# Patient Record
Sex: Female | Born: 1955 | Race: White | Hispanic: No | Marital: Single | Smoking: Never smoker
Health system: Southern US, Community
[De-identification: ages and names within clinical notes are randomized; demographics above are authoritative.]

## PROBLEM LIST (undated history)

## (undated) DIAGNOSIS — T7840XA Allergy, unspecified, initial encounter: Secondary | ICD-10-CM

## (undated) DIAGNOSIS — M8580 Other specified disorders of bone density and structure, unspecified site: Secondary | ICD-10-CM

## (undated) DIAGNOSIS — M1990 Unspecified osteoarthritis, unspecified site: Secondary | ICD-10-CM

## (undated) HISTORY — DX: Other specified disorders of bone density and structure, unspecified site: M85.80

## (undated) HISTORY — DX: Unspecified osteoarthritis, unspecified site: M19.90

## (undated) HISTORY — DX: Allergy, unspecified, initial encounter: T78.40XA

## (undated) HISTORY — PX: ABDOMINAL HYSTERECTOMY: SHX81

---

## 2002-12-09 ENCOUNTER — Encounter: Payer: Self-pay | Admitting: Emergency Medicine

## 2002-12-09 ENCOUNTER — Emergency Department (HOSPITAL_COMMUNITY): Admission: EM | Admit: 2002-12-09 | Discharge: 2002-12-09 | Payer: Self-pay | Admitting: Emergency Medicine

## 2003-06-18 ENCOUNTER — Ambulatory Visit (HOSPITAL_COMMUNITY): Admission: RE | Admit: 2003-06-18 | Discharge: 2003-06-18 | Payer: Self-pay | Admitting: Obstetrics and Gynecology

## 2005-01-16 ENCOUNTER — Other Ambulatory Visit: Admission: RE | Admit: 2005-01-16 | Discharge: 2005-01-16 | Payer: Self-pay | Admitting: Obstetrics and Gynecology

## 2006-01-21 ENCOUNTER — Other Ambulatory Visit: Admission: RE | Admit: 2006-01-21 | Discharge: 2006-01-21 | Payer: Self-pay | Admitting: Obstetrics and Gynecology

## 2007-01-29 ENCOUNTER — Other Ambulatory Visit: Admission: RE | Admit: 2007-01-29 | Discharge: 2007-01-29 | Payer: Self-pay | Admitting: Obstetrics and Gynecology

## 2008-02-03 ENCOUNTER — Ambulatory Visit: Payer: Self-pay | Admitting: Obstetrics and Gynecology

## 2008-02-03 ENCOUNTER — Encounter: Payer: Self-pay | Admitting: Obstetrics and Gynecology

## 2008-02-03 ENCOUNTER — Other Ambulatory Visit: Admission: RE | Admit: 2008-02-03 | Discharge: 2008-02-03 | Payer: Self-pay | Admitting: Obstetrics and Gynecology

## 2008-03-17 ENCOUNTER — Ambulatory Visit: Payer: Self-pay | Admitting: Obstetrics and Gynecology

## 2008-03-19 ENCOUNTER — Ambulatory Visit: Payer: Self-pay | Admitting: Obstetrics and Gynecology

## 2008-03-22 ENCOUNTER — Inpatient Hospital Stay (HOSPITAL_COMMUNITY): Admission: RE | Admit: 2008-03-22 | Discharge: 2008-03-24 | Payer: Self-pay | Admitting: Obstetrics and Gynecology

## 2008-03-22 ENCOUNTER — Ambulatory Visit: Payer: Self-pay | Admitting: Obstetrics and Gynecology

## 2008-03-22 ENCOUNTER — Encounter: Payer: Self-pay | Admitting: Obstetrics and Gynecology

## 2008-03-30 ENCOUNTER — Ambulatory Visit: Payer: Self-pay | Admitting: Obstetrics and Gynecology

## 2008-05-03 ENCOUNTER — Ambulatory Visit: Payer: Self-pay | Admitting: Obstetrics and Gynecology

## 2009-02-17 ENCOUNTER — Other Ambulatory Visit: Admission: RE | Admit: 2009-02-17 | Discharge: 2009-02-17 | Payer: Self-pay | Admitting: Obstetrics and Gynecology

## 2009-02-17 ENCOUNTER — Ambulatory Visit: Payer: Self-pay | Admitting: Obstetrics and Gynecology

## 2010-03-16 ENCOUNTER — Ambulatory Visit
Admission: RE | Admit: 2010-03-16 | Discharge: 2010-03-16 | Payer: Self-pay | Source: Home / Self Care | Attending: Obstetrics and Gynecology | Admitting: Obstetrics and Gynecology

## 2010-03-16 ENCOUNTER — Other Ambulatory Visit: Payer: Self-pay | Admitting: Obstetrics and Gynecology

## 2010-03-16 ENCOUNTER — Other Ambulatory Visit
Admission: RE | Admit: 2010-03-16 | Discharge: 2010-03-16 | Payer: Self-pay | Source: Home / Self Care | Admitting: Obstetrics and Gynecology

## 2010-03-19 ENCOUNTER — Encounter: Payer: Self-pay | Admitting: Obstetrics and Gynecology

## 2010-06-12 LAB — CBC
HCT: 26.1 % — ABNORMAL LOW (ref 36.0–46.0)
HCT: 38.2 % (ref 36.0–46.0)
Hemoglobin: 13.1 g/dL (ref 12.0–15.0)
MCHC: 34.3 g/dL (ref 30.0–36.0)
MCV: 97.1 fL (ref 78.0–100.0)
MCV: 97.3 fL (ref 78.0–100.0)
Platelets: 254 10*3/uL (ref 150–400)
RBC: 2.69 MIL/uL — ABNORMAL LOW (ref 3.87–5.11)
RBC: 3.92 MIL/uL (ref 3.87–5.11)
RDW: 13.1 % (ref 11.5–15.5)
WBC: 5.4 10*3/uL (ref 4.0–10.5)
WBC: 5.5 10*3/uL (ref 4.0–10.5)

## 2010-06-12 LAB — URINALYSIS, ROUTINE W REFLEX MICROSCOPIC
Bilirubin Urine: NEGATIVE
Glucose, UA: NEGATIVE mg/dL
Ketones, ur: NEGATIVE mg/dL
Leukocytes, UA: NEGATIVE
Nitrite: NEGATIVE
Protein, ur: NEGATIVE mg/dL
Specific Gravity, Urine: 1.01 (ref 1.005–1.030)
Urobilinogen, UA: 0.2 mg/dL (ref 0.0–1.0)
pH: 8 (ref 5.0–8.0)

## 2010-06-12 LAB — HCG, SERUM, QUALITATIVE: Preg, Serum: NEGATIVE

## 2010-07-11 NOTE — Discharge Summary (Signed)
[name], Megan Lam                ACCOUNT NO.:  [id number]   MEDICAL RECORD NO.:  [id number]          PATIENT TYPE:  INP   LOCATION:  9315                          FACILITY:  [facility]   PHYSICIAN:  Daniel L. Gottsegen, M.D.DATE OF BIRTH:  1955-03-04   DATE OF ADMISSION:  03/22/2008  DATE OF DISCHARGE:  03/24/2008                               DISCHARGE SUMMARY   The patient is a 55 year old female who had extremely large fibroids and  a cyst on her left ovary, who entered the hospital for surgery.  On the  day of admission, a supracervical hysterectomy and left salpingo-  oophorectomy was done.  The patient's postoperative course was  uneventful.  By the second postoperative day, she was passing gas,  passing urine, and keeping food down.  She was discharged on Vicodin  5/500 and Motrin to use whichever was more appropriate for pain relief.  She will see me in 1 week.  She will advance her diet as tolerated and  she will be completely ambulatory.  Final pathology report is not  available at time of dictation.   LABORATORY DATA:  On admission showed a normal CBC and a normal  urinalysis.   FINAL DIAGNOSES:  Symptomatic fibroids, left ovarian cyst.   OPERATION:  Supracervical hysterectomy, left salpingo-oophorectomy.   CONDITION ON DISCHARGE:  Improved.      Daniel L. Eda Paschal, M.D.  Electronically Signed     DLG/MEDQ  D:  03/24/2008  T:  03/24/2008  Job:  [id number]

## 2010-07-11 NOTE — Op Note (Signed)
Megan Lam, SHANKMAN                ACCOUNT NO.:  [id number]   MEDICAL RECORD NO.:  [id number]          PATIENT TYPE:  INP   LOCATION:  9315                          FACILITY:  [facility]   PHYSICIAN:  Daniel L. Gottsegen, M.D.DATE OF BIRTH:  December 30, 1955   DATE OF PROCEDURE:  03/22/2008  DATE OF DISCHARGE:                               OPERATIVE REPORT   PREOPERATIVE DIAGNOSES:  Symptomatic fibroids, left ovarian cyst.   POSTOPERATIVE DIAGNOSES:  Symptomatic fibroids, left ovarian cyst.   OPERATIONS:  Supracervical hysterectomy, left salpingo-oophorectomy.   SURGEON:  Daniel L. Eda Paschal, MD   FIRST ASSISTANT:  Timothy P. Fontaine, MD   FINDINGS:  At the time of surgery, the patient had multiple fibroids,  enlarging her uterus to at least 24-25 weeks' size.  When the uterus was  removed, total weight of the uterus was over 3500 g.  Left ovary was  enlarged by an ovarian cyst that appeared to have replaced most of the  left ovary, right ovary and tube were normal.  Pelvic peritoneum was  free of disease.  The patient's ileocecal junction was identified.  She  had a normal appendix, rest of the exploration of the abdomen was  normal.   PROCEDURE:  After adequate general endotracheal anesthesia, the patient  was placed in supine position, prepped and draped in the usual sterile  manner.  A Foley catheter was inserted into the patient's bladder.  A  Pfannenstiel incision was made, the fascia was opened transversely.  The  peritoneum was entered vertically.  Subcutaneous bleeders were clamped  and bovied as encountered.  When the peritoneal cavity was opened, the  above findings were noted.  The uterus was delivered with some  difficulty, but could be delivered.  The round ligaments were bipolared  and cut and a vesicouterine fold of peritoneum was sharply incised.  On  the right side the utero-ovarian ligament and fallopian tube were  clamped, cut, and doubly suture ligated leaving the  ovary intact.  Prior  to starting the dissection, pelvic peritoneal washings were obtained.  On the left side, there was a large ovarian cyst of 5 plus centimeters  that appeared to replace most of the ovary and was felt that the  patient's age the safest way to handle it without rupture was to remove  the ovary.  Therefore, the left IP ligament was clamped, cut, and doubly  suture ligated.  The bladder flap was advanced inferiorly.  There was a  fibroid on the lower uterine segment that required some dissection to  get it free from the bladder flap.  The uterine arteries were clamped,  cut, and doubly suture ligated.  There were additional bites needed  below the uterine arteries in order to control bleeding, and these were  clamped, cut, and doubly suture ligated.  Finally it was felt that the  uterus was free, it was bisected at  the cervix  and the uterus was sent  to Pathology for tissue diagnosis as was the left ovary tube.  The  uterus weighed 3500 g.  Suture material for all the above-mentioned  pedicles was #1 chromic catgut.  The cervical stump was treated in the  endocervix with cautery to prevent CIN and also to prevent postoperative  bleeding, and then the cervix was closed with figure-of-eights of 0  chromic.  There was some oozing that was controlled with the Bovie.  Surgicel was left in the stump.  The vesicouterine fold of peritoneum  was then sewn posteriorly to the posterior peritoneum to cover the  cervical stump with an interrupted 2-0 Vicryl.  Two sponge, needle, and  instruments counts were correct.  The perineal cavity was closed with a  running 0 Vicryl.  An On-Q catheter was placed both sub and super  fascial.  The fascia was then closed with two running 0 Vicryl.  The  skin was closed with a 3-0 plain subcuticular suture.  On-Q was filled  with 1% Xylocaine, and then it was to be continued with 0.5% Marcaine.  Two sponge, needle, and instruments counts were  correct.  Estimated  blood loss was 300 mL and none replaced.  The patient tolerated the  procedure well and left the operating room in satisfactory condition,  draining clear urine from her Foley catheter.      Daniel L. Eda Paschal, M.D.  Electronically Signed     DLG/MEDQ  D:  03/22/2008  T:  03/22/2008  Job:  [id number]

## 2010-07-11 NOTE — H&P (Signed)
[name], LOW                ACCOUNT NO.:  [id number]   MEDICAL RECORD NO.:  [id number]          PATIENT TYPE:  AMB   LOCATION:  SDC                           FACILITY:  [facility]   PHYSICIAN:  Daniel L. Gottsegen, M.D.DATE OF BIRTH:  08-25-1955   DATE OF ADMISSION:  DATE OF DISCHARGE:                              HISTORY & PHYSICAL   She is for the operating room on Monday, January 25 at 7:30.   CHIEF COMPLAINT:  Extremely large fibroids with menorrhagia, anemia,  abdominal pain and pressure, and external compression on her ureters  causing dilatation of her calyces of her kidneys.   HISTORY OF PRESENT ILLNESS:  The patient is a 55 year old nulligravida,  who was first seen by me in the office in 2004 when she presented with  menometrorrhagia and a hemoglobin of 7.6.  At that time, she had  fibroids that were approximately 16-week size.  She was treated  conservatively with iron and oral progestins and her bleeding stopped.  Over the years, she has been treated with oral progestins including oral  contraceptives, Megace and iron, and she has remained fairly normal in  terms of her hemoglobin.  However, her fibroids have continued to grow.  Multiple discussions were had with her regarding myomectomy or  hysterectomy.  The patient was always reluctant to proceed and she  really did pretty well until the last year when her abdominal discomfort  and pain became so severe that she thought she should proceed with  surgery.  She was seen by her internist, because some of the pressure  actually  was in the upper quadrants.  A CT scan had been ordered which  was completely normal, except for mild caliectasis and ureterectasis.  Because of this persistence of pain, renal impairment, hemoglobin that  will sometimes drop when patient bleeds heavily in spite of oral  contraceptives, she now enters the hospital for surgery.  Discussions  were had with her.  I told her that at her age I felt  hysterectomy was a  better treatment.  After much reflection, she is comfortable with this;  however, she said she would like to keep her cervix.  We, therefore,  will do a supracervical hysterectomy.  She also would like to keep her  ovarys.  She understands the pros and cons of having her ovaries removed  including the risk of ovarian cancer, but she would like to proceed with  conservative therapy.  The last time that she was scanned, we could not  see a right ovary at all.  Her left ovary, however, had a thin-walled,  echo-free cystic mass of about 3-1/2 cm.  The patient has given me  permission to do an ovarian cystectomy or unilateral salpingo-  oophorectomy, but we will only do a bilateral oophorectomy for a  malignancy which we did not believe we are going to find.   PAST MEDICAL HISTORY:  Otherwise unremarkable.  She is on no medications  other than oral progestins and her iron.  She is allergic to no drugs.  Her only other surgery has been hand  surgery in 2005 and wisdom tooth  surgery.   FAMILY HISTORY:  Her grandparents and her father were diabetic.  Her  father is also hypertensive.  She has a paternal grandmother and a first  cousin with colon cancer.  Her grandfather and her father have coronary  artery disease and she has a maternal aunt with breast cancer.   SOCIAL HISTORY:  Noncontributory.   REVIEW OF SYSTEMS:  HEENT:  Negative.  CARDIAC:  Negative.  RESPIRATORY:  Negative.  GI:  Severe abdominal discomfort, felt to be secondary to her  fibroids.  GU:  Urinary frequency, probably skin secondary to  compression from fibroids.  She has a negative urine and she has no  other urinary symptoms.   PHYSICAL EXAMINATION:  GENERAL:  The patient is a well-developed and  well-nourished female in no acute distress.  VITAL SIGNS:  Her blood pressure is 100/74, her pulse is 80 and regular,  her respirations 16 and unlabored, and she is afebrile.  HEENT:  Within  normal  limits.  NECK:  Supple.  Trachea in the midline.  Thyroid is not enlarged.  LUNGS:  Clear to P&A.  HEART:  No thrills, heaves, or murmurs.  BREASTS:  No masses.  ABDOMEN:  An extremely large fibroid uterus that extends above the  umbilicus and is best estimated to be the size of someone who is 24  weeks' pregnant.  Other than that, there are no abdominal masses.  PELVIC:  External is within normal limits.  BUS is within normal limits.  Vaginal exam is within normal limits.  Cervix is clean.  Pap smear shows  no atypia.  Uterus is enlarged by multiple myomas as noted above to 24-  week size.  Adnexa are not accessible to exam due to the above.  RECTAL:  Negative.  EXTREMITIES:  Within normal limits.   ADMISSION IMPRESSION:  Extremely large fibroids with abdominal pain.  Compression of ureters, anemia, and menorrhagia.   PLAN:  Supracervical hysterectomy, treatment of any adnexal disease  surgically with the exception of bilateral oophorectomy unless a  malignancy is found.      Daniel L. Eda Paschal, M.D.  Electronically Signed     DLG/MEDQ  D:  03/21/2008  T:  03/22/2008  Job:  [id number]

## 2010-12-20 ENCOUNTER — Other Ambulatory Visit: Payer: Self-pay | Admitting: Physician Assistant

## 2010-12-20 ENCOUNTER — Ambulatory Visit
Admission: RE | Admit: 2010-12-20 | Discharge: 2010-12-20 | Disposition: A | Discharge status: Home / Self Care | Payer: Self-pay | Source: Ambulatory Visit | Attending: Physician Assistant | Admitting: Physician Assistant

## 2010-12-20 ENCOUNTER — Ambulatory Visit
Admission: RE | Admit: 2010-12-20 | Discharge: 2010-12-20 | Disposition: A | Discharge status: Home / Self Care | Payer: No Typology Code available for payment source | Source: Ambulatory Visit | Attending: Physician Assistant | Admitting: Physician Assistant

## 2010-12-20 DIAGNOSIS — M79609 Pain in unspecified limb: Secondary | ICD-10-CM

## 2010-12-21 ENCOUNTER — Encounter: Payer: Self-pay | Admitting: Obstetrics and Gynecology

## 2011-12-24 ENCOUNTER — Encounter: Payer: Self-pay | Admitting: Obstetrics and Gynecology

## 2012-03-21 ENCOUNTER — Encounter: Payer: Self-pay | Admitting: Gastroenterology

## 2012-04-17 ENCOUNTER — Ambulatory Visit: Payer: Federal, State, Local not specified - PPO | Admitting: *Deleted

## 2012-04-17 DIAGNOSIS — Z1211 Encounter for screening for malignant neoplasm of colon: Secondary | ICD-10-CM

## 2012-04-17 NOTE — Progress Notes (Signed)
Pt already has colonoscopy set up at [facility] for 04-28-12; she already had her pre-procedure visit.  She is unsure why she is here today.  I reviewed her chart and feel that her family MD referred her to both places.  She decides to keep her appt with the [facility] physician  Colonoscopy for 04-23-12 cancelled

## 2012-04-23 ENCOUNTER — Encounter: Payer: Self-pay | Admitting: Gastroenterology

## 2012-07-12 DIAGNOSIS — R21 Rash and other nonspecific skin eruption: Secondary | ICD-10-CM | POA: Insufficient documentation

## 2012-07-12 DIAGNOSIS — Z79899 Other long term (current) drug therapy: Secondary | ICD-10-CM | POA: Insufficient documentation

## 2012-07-12 DIAGNOSIS — Y939 Activity, unspecified: Secondary | ICD-10-CM | POA: Insufficient documentation

## 2012-07-12 DIAGNOSIS — Y929 Unspecified place or not applicable: Secondary | ICD-10-CM | POA: Insufficient documentation

## 2012-07-12 DIAGNOSIS — I998 Other disorder of circulatory system: Secondary | ICD-10-CM | POA: Insufficient documentation

## 2012-07-12 NOTE — ED Notes (Signed)
Pt states insect bite to upper right thigh last night, and today noticed bruising around site.

## 2012-07-13 ENCOUNTER — Encounter (HOSPITAL_COMMUNITY): Payer: Self-pay | Admitting: Emergency Medicine

## 2012-07-13 ENCOUNTER — Emergency Department (HOSPITAL_COMMUNITY)
Admission: EM | Admit: 2012-07-13 | Discharge: 2012-07-13 | Disposition: A | Discharge status: Home / Self Care | Payer: Federal, State, Local not specified - PPO | Attending: Emergency Medicine | Admitting: Emergency Medicine

## 2012-07-13 DIAGNOSIS — R58 Hemorrhage, not elsewhere classified: Secondary | ICD-10-CM

## 2012-07-13 LAB — CBC WITH DIFFERENTIAL/PLATELET
Basophils Absolute: 0 10*3/uL (ref 0.0–0.1)
Basophils Relative: 1 % (ref 0–1)
Eosinophils Absolute: 0.1 10*3/uL (ref 0.0–0.7)
Eosinophils Relative: 3 % (ref 0–5)
HCT: 36.6 % (ref 36.0–46.0)
MCH: 31.2 pg (ref 26.0–34.0)
MCHC: 35.2 g/dL (ref 30.0–36.0)
MCV: 88.6 fL (ref 78.0–100.0)
Monocytes Absolute: 0.3 10*3/uL (ref 0.1–1.0)
RDW: 12.7 % (ref 11.5–15.5)

## 2012-07-13 LAB — POCT I-STAT, CHEM 8
Creatinine, Ser: 0.6 mg/dL (ref 0.50–1.10)
Glucose, Bld: 102 mg/dL — ABNORMAL HIGH (ref 70–99)
Hemoglobin: 12.2 g/dL (ref 12.0–15.0)
Potassium: 3.8 mEq/L (ref 3.5–5.1)
TCO2: 28 mmol/L (ref 0–100)

## 2012-07-13 MED ORDER — DIPHENHYDRAMINE HCL 25 MG PO TABS: 50.0000 mg | ORAL_TABLET | ORAL | Status: AC | PRN

## 2012-07-13 NOTE — ED Provider Notes (Addendum)
History     CSN: [id number]  Arrival date & time 07/12/12  2318   First MD Initiated Contact with Patient 07/13/12 0241      Chief Complaint  Patient presents with  . Insect Bite    (Consider location/radiation/quality/duration/timing/severity/associated sxs/prior treatment) Patient is a 57 y.o. female presenting with rash. The history is provided by the patient. No language interpreter was used.  Rash Location:  Leg (2 small area one on each proximal thigh) Quality: bruising and itchiness   Quality: not blistering, not burning, not draining, not painful, not peeling, not swelling and not weeping   Severity:  Mild Onset quality:  Sudden Timing:  Constant Progression:  Unchanged Chronicity:  New Context: insect bite/sting   Relieved by:  Nothing Exacerbated by: scratching. Ineffective treatments:  None tried Associated symptoms: no fatigue, no myalgias and no periorbital edema   Working outside thought she was bit by an insect and has scratched and caused bruising  No past medical history on file.  No past surgical history on file.  No family history on file.  History  Substance Use Topics  . Smoking status: Not on file  . Smokeless tobacco: Not on file  . Alcohol Use: Not on file    OB History   No data available      Review of Systems  Constitutional: Negative for fatigue.  Musculoskeletal: Negative for myalgias.  Skin: Positive for rash.  All other systems reviewed and are negative.    Allergies  Review of patient's allergies indicates no known allergies.  Home Medications   Current Outpatient Rx  Name  Route  Sig  Dispense  Refill  . cholecalciferol (VITAMIN D) 1000 UNITS tablet   Oral   Take 1,000 Units by mouth daily.         Marland Kitchen ibuprofen (ADVIL,MOTRIN) 200 MG tablet   Oral   Take 200-400 mg by mouth every 6 (six) hours as needed for pain.         . Multiple Vitamin (MULTIVITAMIN WITH MINERALS) TABS   Oral   Take 1 tablet by mouth  daily.           BP 118/77  Pulse 70  Temp(Src) 98.1 F (36.7 C) (Oral)  Resp 18  SpO2 98%  Physical Exam  Constitutional: She is oriented to person, place, and time. She appears well-developed and well-nourished. No distress.  HENT:  Head: Normocephalic and atraumatic.  Mouth/Throat: Oropharynx is clear and moist.  Eyes: Conjunctivae are normal. Pupils are equal, round, and reactive to light.  Neck: Normal range of motion. Neck supple.  Cardiovascular: Normal rate, regular rhythm and intact distal pulses.   Pulmonary/Chest: Effort normal and breath sounds normal. She has no wheezes. She has no rales.  Abdominal: Soft. Bowel sounds are normal. There is no tenderness. There is no rebound and no guarding.  Musculoskeletal: Normal range of motion.  Lymphadenopathy:    She has no cervical adenopathy.  Neurological: She is alert and oriented to person, place, and time.  Skin: Skin is warm and dry.  2 small ecchymoses without warmth or induration  Psychiatric: She has a normal mood and affect.    ED Course  Procedures (including critical care time)  Labs Reviewed  CBC WITH DIFFERENTIAL  PROTIME-INR   No results found.   No diagnosis found.    MDM  Patient reports inset bites but did not see insects,  Appears to be scratched causing bruising and abrasions to skin of thighs. Follow  up with your family doctor for ongoing care        Khadijatou Borak K Caroll Weinheimer-Rasch, MD 07/13/12 0415  Jasmine Awe, MD 07/13/12 [phone]

## 2012-10-18 ENCOUNTER — Ambulatory Visit (INDEPENDENT_AMBULATORY_CARE_PROVIDER_SITE_OTHER): Payer: Federal, State, Local not specified - PPO | Admitting: Family Medicine

## 2012-10-18 ENCOUNTER — Ambulatory Visit: Payer: Federal, State, Local not specified - PPO

## 2012-10-18 VITALS — BP 130/70 | HR 80 | Temp 97.9°F | Resp 16 | Ht 65.0 in | Wt 142.2 lb

## 2012-10-18 DIAGNOSIS — M25579 Pain in unspecified ankle and joints of unspecified foot: Secondary | ICD-10-CM

## 2012-10-18 DIAGNOSIS — S93602A Unspecified sprain of left foot, initial encounter: Secondary | ICD-10-CM

## 2012-10-18 DIAGNOSIS — M25572 Pain in left ankle and joints of left foot: Secondary | ICD-10-CM

## 2012-10-18 NOTE — Patient Instructions (Addendum)
Foot Sprain °The muscles and cord like structures which attach muscle to bone (tendons) that surround the feet are made up of units. A foot sprain can occur at the weakest spot in any of these units. This condition is most often caused by injury to or overuse of the foot, as from playing contact sports, or aggravating a previous injury, or from poor conditioning, or obesity. °SYMPTOMS °· Pain with movement of the foot. °· Tenderness and swelling at the injury site. °· Loss of strength is present in moderate or severe sprains. °THE THREE GRADES OR SEVERITY OF FOOT SPRAIN ARE: °· Mild (Grade I): Slightly pulled muscle without tearing of muscle or tendon fibers or loss of strength. °· Moderate (Grade II): Tearing of fibers in a muscle, tendon, or at the attachment to bone, with small decrease in strength. °· Severe (Grade III): Rupture of the muscle-tendon-bone attachment, with separation of fibers. Severe sprain requires surgical repair. Often repeating (chronic) sprains are caused by overuse. Sudden (acute) sprains are caused by direct injury or over-use. °DIAGNOSIS  °Diagnosis of this condition is usually by your own observation. If problems continue, a caregiver may be required for further evaluation and treatment. X-rays may be required to make sure there are not breaks in the bones (fractures) present. Continued problems may require physical therapy for treatment. °PREVENTION °· Use strength and conditioning exercises appropriate for your sport. °· Warm up properly prior to working out. °· Use athletic shoes that are made for the sport you are participating in. °· Allow adequate time for healing. Early return to activities makes repeat injury more likely, and can lead to an unstable arthritic foot that can result in prolonged disability. Mild sprains generally heal in 3 to 10 days, with moderate and severe sprains taking 2 to 10 weeks. Your caregiver can help you determine the proper time required for  healing. °HOME CARE INSTRUCTIONS  °· Apply ice to the injury for 15-20 minutes, 3-4 times per day. Put the ice in a plastic bag and place a towel between the bag of ice and your skin. °· An elastic wrap (like an Ace bandage) may be used to keep swelling down. °· Keep foot above the level of the heart, or at least raised on a footstool, when swelling and pain are present. °· Try to avoid use other than gentle range of motion while the foot is painful. Do not resume use until instructed by your caregiver. Then begin use gradually, not increasing use to the point of pain. If pain does develop, decrease use and continue the above measures, gradually increasing activities that do not cause discomfort, until you gradually achieve normal use. °· Use crutches if and as instructed, and for the length of time instructed. °· Keep injured foot and ankle wrapped between treatments. °· Massage foot and ankle for comfort and to keep swelling down. Massage from the toes up towards the knee. °· Only take over-the-counter or prescription medicines for pain, discomfort, or fever as directed by your caregiver. °SEEK IMMEDIATE MEDICAL CARE IF:  °· Your pain and swelling increase, or pain is not controlled with medications. °· You have loss of feeling in your foot or your foot turns cold or blue. °· You develop new, unexplained symptoms, or an increase of the symptoms that brought you to your caregiver. °MAKE SURE YOU:  °· Understand these instructions. °· Will watch your condition. °· Will get help right away if you are not doing well or get worse. °Document Released:   08/04/2001 Document Revised: 05/07/2011 Document Reviewed: 10/02/2007 °[employer]® Patient Information ©2014 [employer]. ° °

## 2012-10-18 NOTE — Progress Notes (Signed)
Subjective:    Patient ID: Megan Lam, female    DOB: 05-30-1955, 57 y.o.   MRN: [id number] Chief Complaint  Patient presents with  . Foot Pain    Left - Stepped off of  porch onto brick and fell x yesterday  . Hand Pain    Right - Stepped off of porch onto brick and fell x yesterday    HPI  Stepped off little step from porch and landed wrong on her left foot.  Not a big drop at all and not sure how she landed. Caught self with right hand so it is a little stiff and better today but really concerned about left foot which is already bruised and very swollen - was very painful to walk on last night, can walk on it today but still painful if surface is uneven and turns a certain way it hurts.  Took some ibuprofen 800mg  po x 1 and iced hand and foot last night.  Past Medical History  Diagnosis Date  . Allergy   . Arthritis    Current Outpatient Prescriptions on File Prior to Visit  Medication Sig Dispense Refill  . cholecalciferol (VITAMIN D) 1000 UNITS tablet Take 1,000 Units by mouth daily.      Marland Kitchen ibuprofen (ADVIL,MOTRIN) 200 MG tablet Take 200-400 mg by mouth every 6 (six) hours as needed for pain.      . Multiple Vitamin (MULTIVITAMIN WITH MINERALS) TABS Take 1 tablet by mouth daily.      . diphenhydrAMINE (BENADRYL) 25 MG tablet Take 2 tablets (50 mg total) by mouth every 4 (four) hours as needed for itching.  20 tablet  0   No current facility-administered medications on file prior to visit.   No Known Allergies   Review of Systems  Constitutional: Positive for activity change. Negative for fever, chills and unexpected weight change.  Musculoskeletal: Positive for joint swelling, arthralgias and gait problem. Negative for myalgias and back pain.  Skin: Positive for color change. Negative for rash and wound.  Neurological: Negative for weakness and numbness.  Psychiatric/Behavioral: Negative for sleep disturbance.       BP 130/70  Pulse 80  Temp(Src) 97.9 F (36.6 C) (Oral)   Resp 16  Ht 5\' 5"  (1.651 m)  Wt 142 lb 3.2 oz (64.501 kg)  BMI 23.66 kg/m2  SpO2 99% Objective:   Physical Exam  Constitutional: She is oriented to person, place, and time. She appears well-developed and well-nourished. No distress.  HENT:  Head: Normocephalic and atraumatic.  Right Ear: External ear normal.  Eyes: Conjunctivae are normal. No scleral icterus.  Pulmonary/Chest: Effort normal.  Musculoskeletal:       Left ankle: She exhibits swelling, ecchymosis and abnormal pulse. She exhibits normal range of motion and no deformity. Tenderness. Head of 5th metatarsal tenderness found. No lateral malleolus, no medial malleolus, no AITFL, no CF ligament, no posterior TFL and no proximal fibula tenderness found. Achilles tendon normal.       Right hand: She exhibits deformity. She exhibits normal range of motion, no tenderness, no bony tenderness, normal capillary refill, no laceration and no swelling. Normal sensation noted. Normal strength noted. She exhibits no finger abduction, no thumb/finger opposition and no wrist extension trouble.       Left foot: She exhibits tenderness, bony tenderness and swelling. She exhibits normal range of motion, normal capillary refill and no deformity.  Pain, swelling, bruising over proximal head of 4th and 5th metatarsals Chronic swelling of right 5th DIP due  to arthritis  Neurological: She is alert and oriented to person, place, and time.  Skin: Skin is warm and dry. She is not diaphoretic. No erythema.  Psychiatric: She has a normal mood and affect. Her behavior is normal.         [facility] reading (PRIMARY) by  Dr. Clelia Croft. Left foot: No acute bony abnormality seen. Assessment & Plan:  Pain in joint, ankle and foot, left - Plan: DG Foot Complete Left  Foot sprain, left, initial encounter - advised firm soled or post-op shoe x 7-10d until healed (post-op shoe fitted in office).  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 If no sig improvement in x 1 wk, RTC.

## 2012-10-21 ENCOUNTER — Other Ambulatory Visit: Payer: Self-pay | Admitting: Nurse Practitioner

## 2012-10-21 ENCOUNTER — Ambulatory Visit (INDEPENDENT_AMBULATORY_CARE_PROVIDER_SITE_OTHER): Payer: Federal, State, Local not specified - PPO | Admitting: Emergency Medicine

## 2012-10-21 ENCOUNTER — Telehealth: Payer: Self-pay | Admitting: Family Medicine

## 2012-10-21 VITALS — BP 120/80 | HR 75 | Temp 98.8°F | Resp 16 | Ht 65.0 in | Wt 142.8 lb

## 2012-10-21 DIAGNOSIS — S92902A Unspecified fracture of left foot, initial encounter for closed fracture: Secondary | ICD-10-CM

## 2012-10-21 DIAGNOSIS — Z1211 Encounter for screening for malignant neoplasm of colon: Secondary | ICD-10-CM

## 2012-10-21 DIAGNOSIS — S92909A Unspecified fracture of unspecified foot, initial encounter for closed fracture: Secondary | ICD-10-CM

## 2012-10-21 NOTE — Progress Notes (Signed)
[facility] [address], [place] [address] [phone]  Date:  10/21/2012   Name:  Marticia Reifschneider   DOB:  [birth year]   MRN:  [id number]  PCP:  Iona Hansen, NP    Chief Complaint: Follow-up   History of Present Illness:  Megan Lam is a 57 y.o. very pleasant female patient who presents with the following:  Seen for an ankle injury and now here after call back for missed fracture.  Has not been compliant as she has no improvement in pain with post op shoe.  Denies other complaint or health concern today.   There are no active problems to display for this patient.   Past Medical History  Diagnosis Date  . Allergy   . Arthritis     Past Surgical History  Procedure Laterality Date  . Abdominal hysterectomy      History  Substance Use Topics  . Smoking status: Never Smoker   . Smokeless tobacco: Not on file  . Alcohol Use: No    Family History  Problem Relation Age of Onset  . Heart disease Father     No Known Allergies  Medication list has been reviewed and updated.  Current Outpatient Prescriptions on File Prior to Visit  Medication Sig Dispense Refill  . cholecalciferol (VITAMIN D) 1000 UNITS tablet Take 1,000 Units by mouth daily.      Marland Kitchen ibuprofen (ADVIL,MOTRIN) 200 MG tablet Take 200-400 mg by mouth every 6 (six) hours as needed for pain.      . Multiple Vitamin (MULTIVITAMIN WITH MINERALS) TABS Take 1 tablet by mouth daily.      . diphenhydrAMINE (BENADRYL) 25 MG tablet Take 2 tablets (50 mg total) by mouth every 4 (four) hours as needed for itching.  20 tablet  0   No current facility-administered medications on file prior to visit.    Review of Systems:  As per HPI, otherwise negative.    Physical Examination: Filed Vitals:   10/21/12 1456  BP: 120/80  Pulse: 75  Temp: 98.8 F (37.1 C)  Resp: 16   Filed Vitals:   10/21/12 1456  Height: 5\' 5"  (1.651 m)  Weight: 142 lb 12.8 oz (64.774 kg)   Body mass index is  23.76 kg/(m^2). Ideal Body Weight: Weight in (lb) to have BMI = 25: 149.9   GEN: WDWN, NAD, Non-toxic, Alert & Oriented x 3 HEENT: Atraumatic, Normocephalic.  Ears and Nose: No external deformity. EXTR: No clubbing/cyanosis/edema NEURO: Normal gait.  PSYCH: Normally interactive. Conversant. Not depressed or anxious appearing.  Calm demeanor.  LEFT foot. Tender lateral ankle consistent with avulsion fracture on calcaneous.  No effusion some ecchymosis.  Assessment and Plan: Fracture foot Ortho Boot   Signed,  Phillips Odor, MD

## 2012-10-21 NOTE — Telephone Encounter (Signed)
Reviewing labs and saw that Megan Lam' xray showed a possible avulsion fracture of her lateral calcaneus near cuboid.  Called pt and informed of possible missed fracture. She is doing much better, wearing post-op shoe when she is on uneven surfaced but otherwise wearing reg shoe, but I recommend she come back into clinic today for repeat left foot xray and clinical eval to re-eval for possibility of fracture and reconsider treatment plan.  Pt plans to return to clinic around 2:30 this afternoon for repeat eval - please expedite her wait/visit as able. Thanks, Megan Lam

## 2012-10-21 NOTE — Telephone Encounter (Signed)
Thanks. I will try to look or her and put in her xray order when she arrives, hopefully this will help

## 2012-10-21 NOTE — Patient Instructions (Addendum)
Calcaneal Fracture  A fracture is a break in the bone. The calcaneus is the large irregular bone in the foot that makes up the heel of the foot. This bone can be fractured in many ways. There are many different ways of treating these fractures. There is not universal agreement on the treatment of these fractures and there is often more than one way of treating these fractures, all of which can be correct.  TREATMENTS  Calcaneal fractures can be treated with:   Immobilization, which means the fracture is casted as it is without changing the positions of the fracture (bone pieces) involved.   Closed reduction, in which the bones are manipulated back into position without opening the site of the fracture (break) using surgery.   ORIF (open reduction and internal fixation), in which the fracture site is opened and the bone pieces are fixed into place with some type of hardware (for example, screws, pins or plates).   Primary arthrodesis (fusion), which means that the joint has enough damage that a procedure is done as the first treatment which will leave the joint permanently stiff. This will decrease function, however usually will leave the joint pain free.  Your caregiver will discuss the type of fracture you have and the treatment that will be best for that problem. If surgery is the treatment of choice, the following is information for you to know and also let your caregiver know about prior to surgery.   LET YOUR CAREGIVERS KNOW ABOUT:   Allergies.   Medications taken including herbs, eye drops, over the counter medications, and creams.   Use of steroids (by mouth or creams).   Previous problems with anesthetics or novocaine.   A family history of anesthetic complication.   Possibility of pregnancy, if this applies.   History of blood clots (thrombophlebitis).   History of bleeding or blood problems.   Previous surgery.   Other health problems.  AFTER THE PROCEDURE  After surgery, you will be taken to  the recovery area where a nurse will watch and check your progress. Once you are awake, stable, and taking fluids well, barring other problems you will be allowed to go home. Once home an ice pack applied to your operative site may help with discomfort and keep the swelling down. Elevate your foot above your heart for the first 7-10 days after surgery. Do this as much as possible.  HOME CARE INSTRUCTIONS    Follow your caregiver's instructions as to activities, exercises, physical therapy, and driving a car. Do not drive a car until your caregiver specifically tells you it is safe to do so.   Use crutches as directed by your caregiver.   Daily exercise is helpful to prevent return of problems. Maintain strength and range of motion as instructed.   Only take over-the-counter or prescription medicines for pain, discomfort, or fever as directed by your caregiver.   Be certain to make all of your follow-up appointments. This is critical for optimal healing.  SEEK MEDICAL CARE IF:    Increased bleeding (more than a small spot) from the wound or from beneath your cast or splint.   Redness, swelling, or increasing pain in the wound or from beneath your cast or splint.   Pus coming from wound or from beneath your cast or splint.   An unexplained oral temperature above 102 F (38.9 C) develops.   A foul smell coming from the wound or dressing or from beneath your cast, splint or removable   fracture boot.  SEEK IMMEDIATE MEDICAL CARE IF:    You develop a rash, have difficulty breathing, or have any problems which seem to be from an allergy.   You develop swelling or inability to move your foot or toes.   You develop tingling or numbness in your foot or toes.   Your foot or toes turn blue, pale or cold.   You develop severe pain in the area of your injury.  If you do not have a window in your cast for observing the wound, a discharge or minor bleeding may show up as a stain on the outside of your cast. Report  these findings to your caregiver. If you have been given a removable fracture boot, a small amount of bleeding through the dressings is normal. Change the dressings as instructed by your caregiver.  MAKE SURE YOU:    Understand these instructions.   Will watch your condition.   Will get help right away if you are not doing well or get worse.  Document Released: 11/22/2004 Document Revised: 05/07/2011 Document Reviewed: 09/17/2007  ExitCare Patient Information 2014 [employer].

## 2012-11-04 IMAGING — CR DG HAND COMPLETE 3+V*L*
3 series · 3 of 3 positions shown · non-contrast
Comparison: None

CLINICAL DATA: Left hand pain.

LEFT HAND - COMPLETE 3+ VIEW

[view not recorded (1 of 3)]
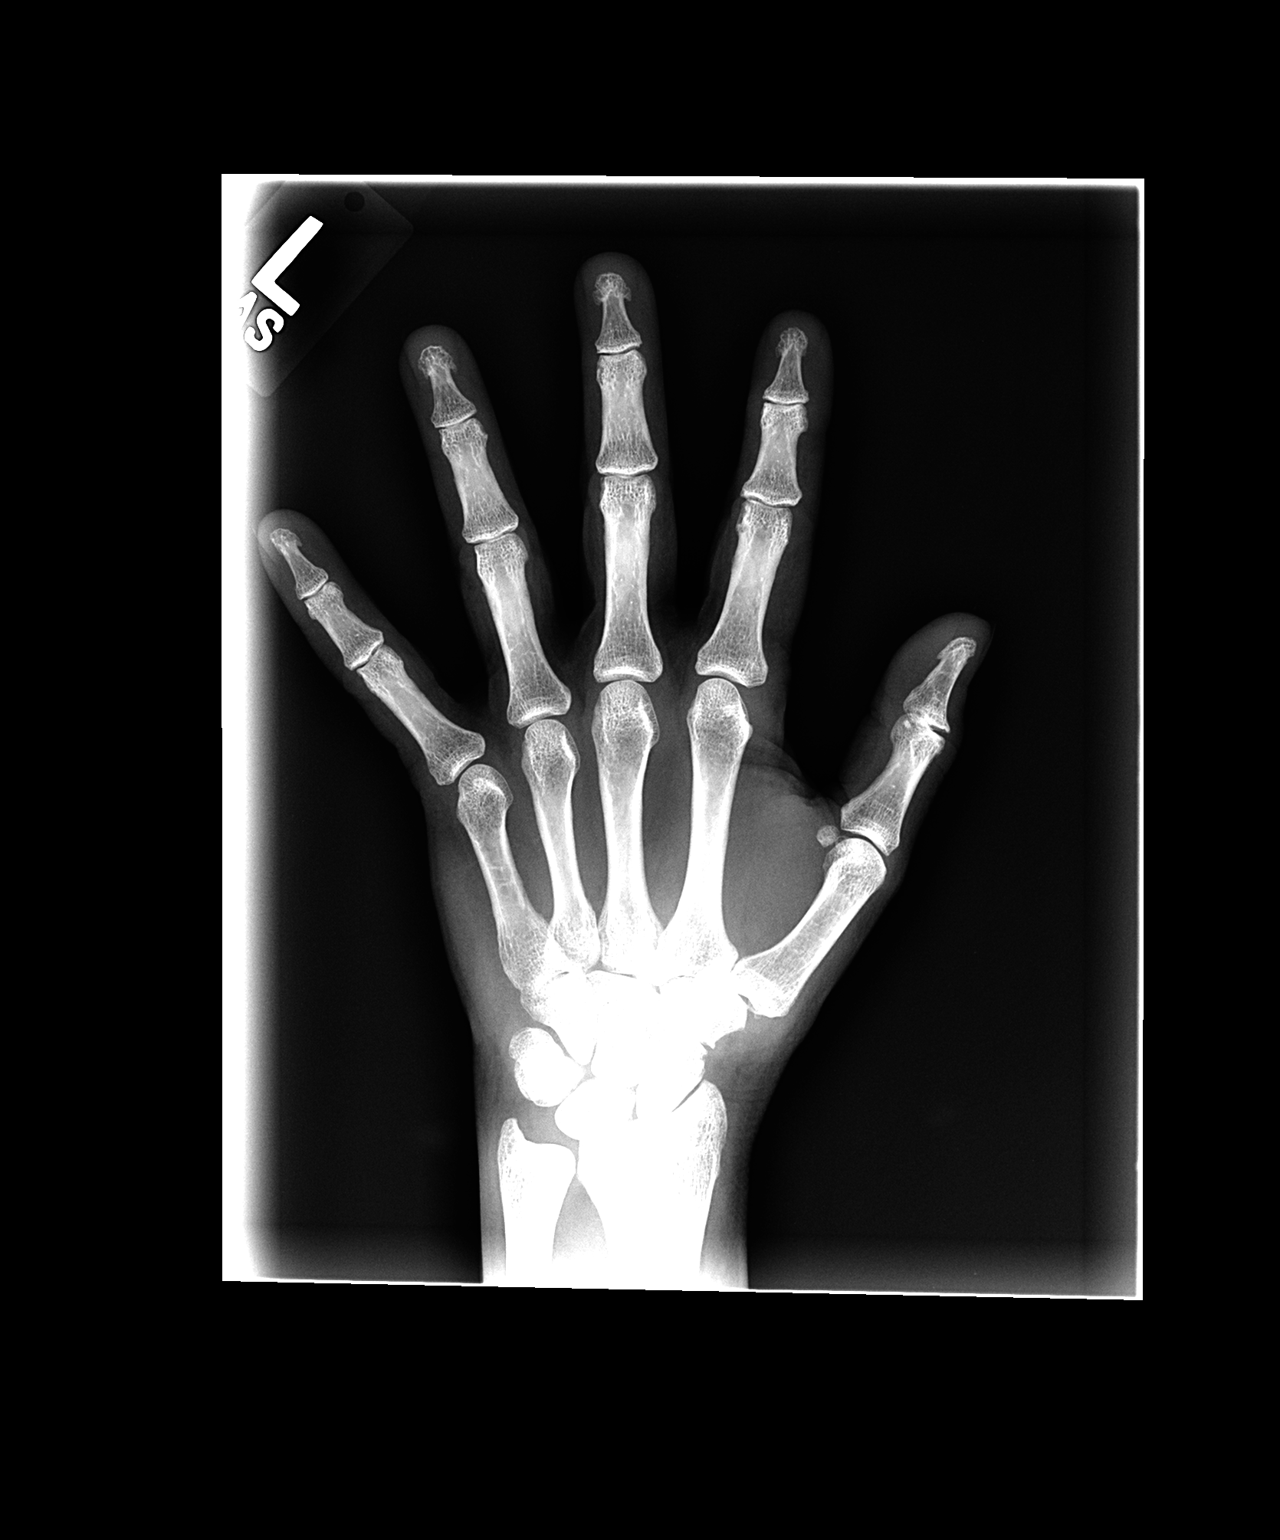

[view not recorded (2 of 3)]
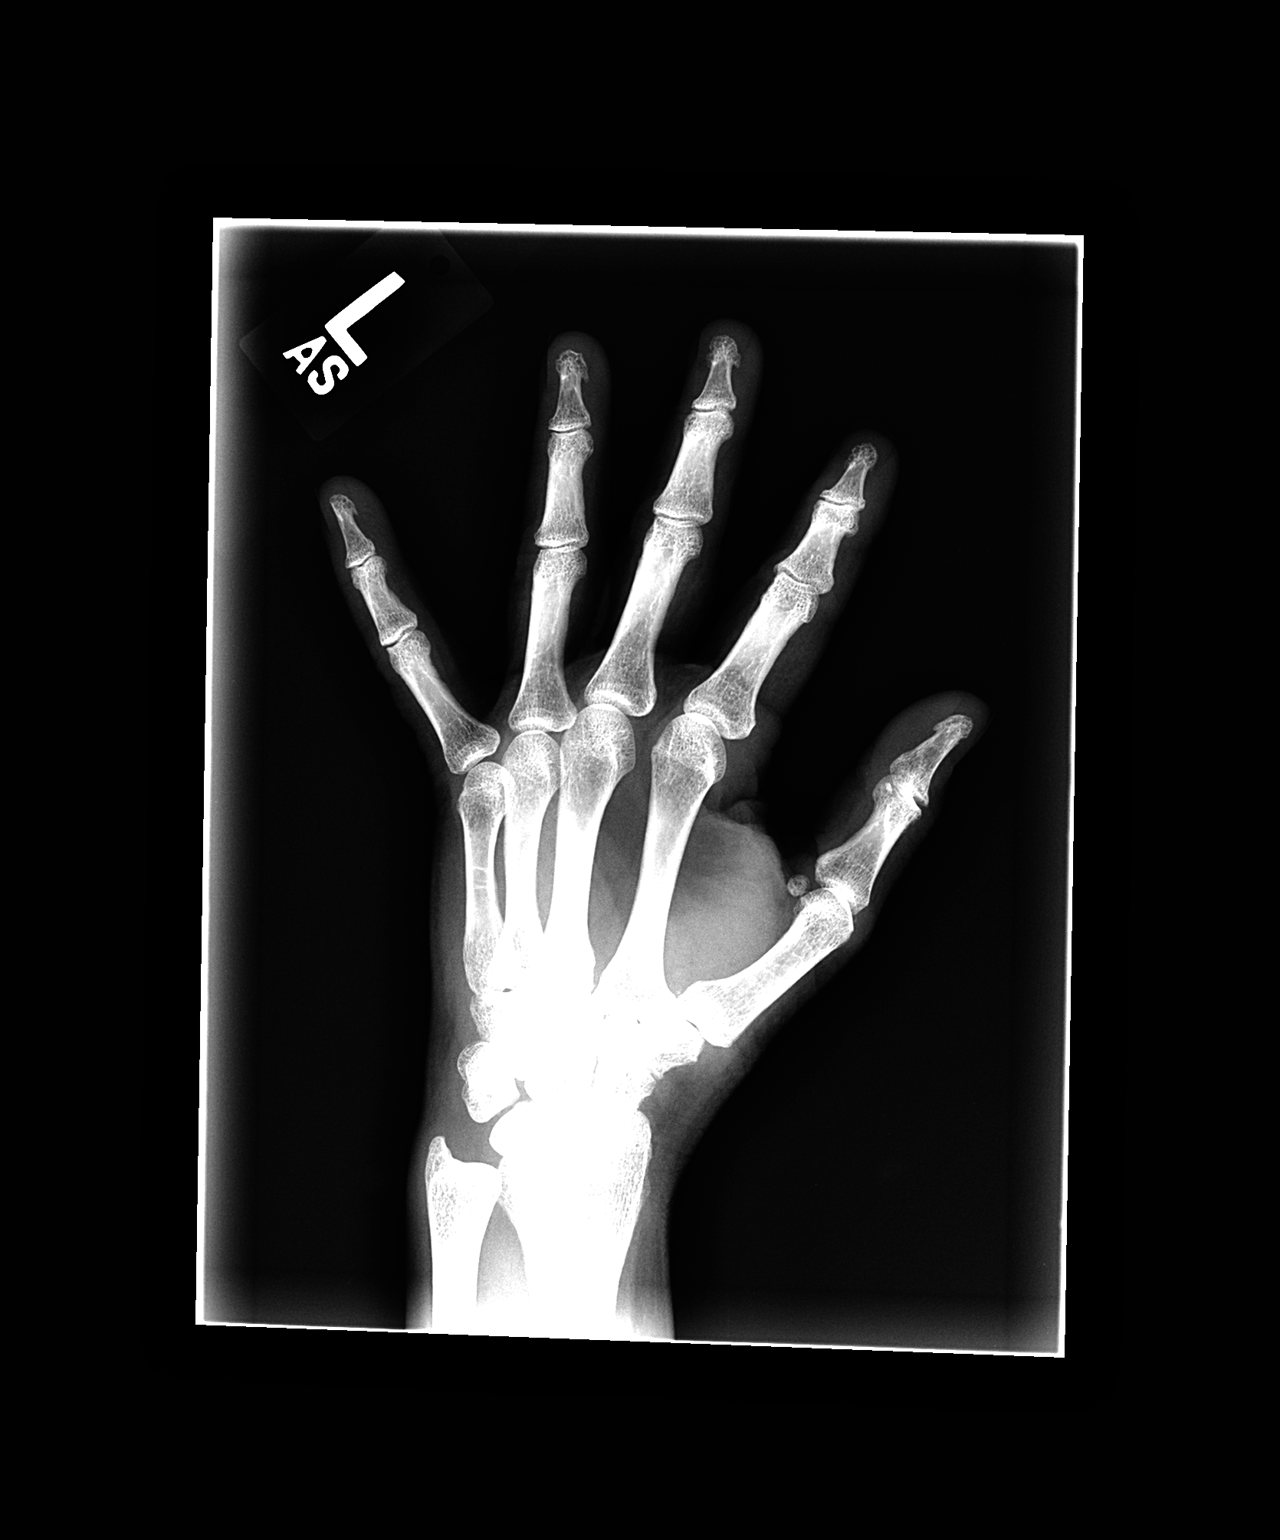

[view not recorded (3 of 3)]
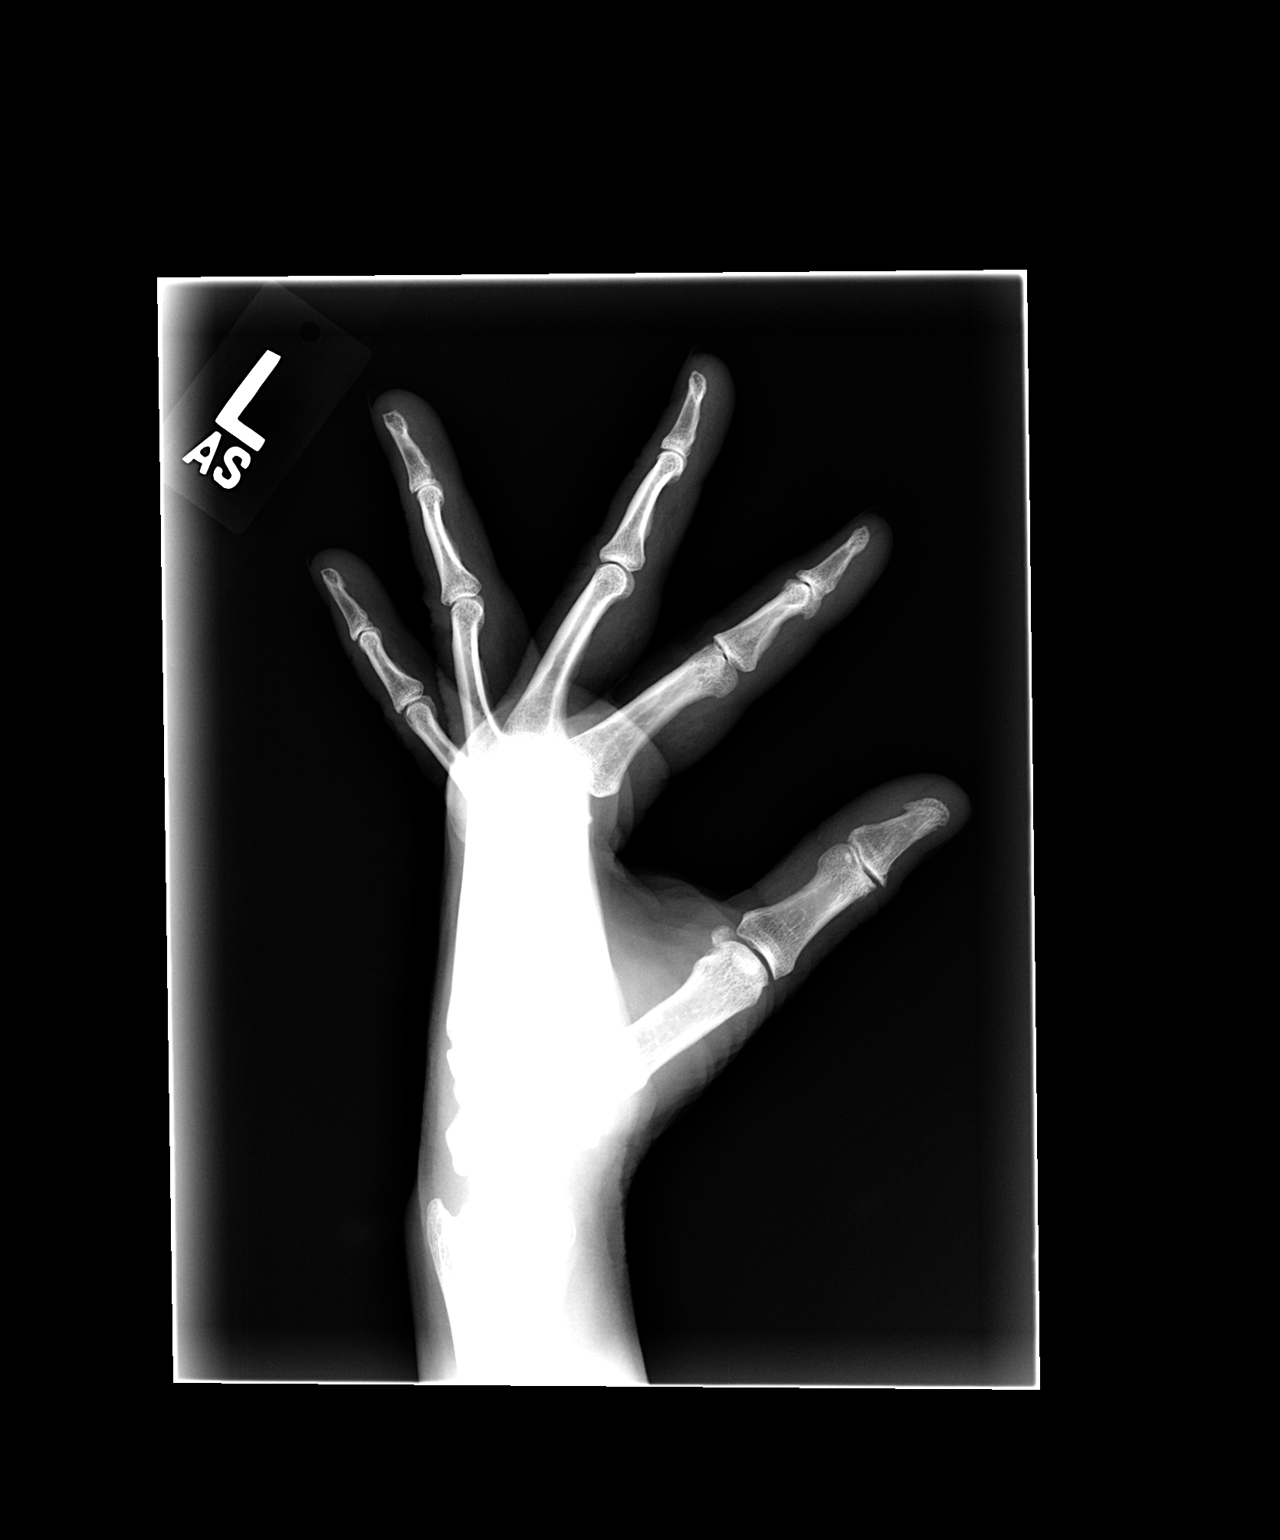

[3 of 3 positions shown; findings below may reference images not displayed]

FINDINGS: The DIP and PIP joints demonstrate mild / early
osteoarthritic type changes but no acute bony findings or erosive
changes.  The metacarpal phalangeal joints are maintained.  The
intercarpal joint spaces and radiocarpal joint spaces are
maintained.  No acute bony findings.
IMPRESSION: Mild / early osteoarthritic type degenerative changes mainly
involving the DIP joints of the fingers.

## 2012-11-05 ENCOUNTER — Other Ambulatory Visit: Payer: Federal, State, Local not specified - PPO

## 2013-01-16 ENCOUNTER — Encounter: Payer: Self-pay | Admitting: Gynecology

## 2013-02-04 ENCOUNTER — Ambulatory Visit
Admission: RE | Admit: 2013-02-04 | Discharge: 2013-02-04 | Disposition: A | Discharge status: Home / Self Care | Payer: Federal, State, Local not specified - PPO | Source: Ambulatory Visit | Attending: Nurse Practitioner | Admitting: Nurse Practitioner

## 2013-02-04 DIAGNOSIS — Z1211 Encounter for screening for malignant neoplasm of colon: Secondary | ICD-10-CM

## 2014-02-05 ENCOUNTER — Other Ambulatory Visit: Payer: Self-pay

## 2014-02-05 DIAGNOSIS — Z1231 Encounter for screening mammogram for malignant neoplasm of breast: Secondary | ICD-10-CM

## 2014-02-25 ENCOUNTER — Ambulatory Visit: Payer: Federal, State, Local not specified - PPO

## 2014-03-16 ENCOUNTER — Ambulatory Visit: Payer: Federal, State, Local not specified - PPO | Admitting: Women's Health

## 2014-03-19 ENCOUNTER — Ambulatory Visit: Payer: Federal, State, Local not specified - PPO | Admitting: Women's Health

## 2014-03-31 ENCOUNTER — Ambulatory Visit (INDEPENDENT_AMBULATORY_CARE_PROVIDER_SITE_OTHER): Payer: Federal, State, Local not specified - PPO | Admitting: Women's Health

## 2014-03-31 ENCOUNTER — Encounter: Payer: Self-pay | Admitting: Women's Health

## 2014-03-31 VITALS — BP 119/75 | Ht 65.0 in | Wt 142.2 lb

## 2014-03-31 DIAGNOSIS — Z01419 Encounter for gynecological examination (general) (routine) without abnormal findings: Secondary | ICD-10-CM

## 2014-03-31 DIAGNOSIS — Z13220 Encounter for screening for lipoid disorders: Secondary | ICD-10-CM

## 2014-03-31 DIAGNOSIS — Z780 Asymptomatic menopausal state: Secondary | ICD-10-CM

## 2014-03-31 DIAGNOSIS — Z90711 Acquired absence of uterus with remaining cervical stump: Secondary | ICD-10-CM | POA: Insufficient documentation

## 2014-03-31 DIAGNOSIS — Z77011 Contact with and (suspected) exposure to lead: Secondary | ICD-10-CM

## 2014-03-31 LAB — URINALYSIS W MICROSCOPIC + REFLEX CULTURE
Bilirubin Urine: NEGATIVE
Casts: NONE SEEN
Crystals: NONE SEEN
Glucose, UA: NEGATIVE mg/dL
KETONES UR: NEGATIVE mg/dL
Nitrite: NEGATIVE
Protein, ur: NEGATIVE mg/dL
SPECIFIC GRAVITY, URINE: 1.015 (ref 1.005–1.030)
UROBILINOGEN UA: 0.2 mg/dL (ref 0.0–1.0)
pH: 7.5 (ref 5.0–8.0)

## 2014-03-31 LAB — CBC WITH DIFFERENTIAL/PLATELET
BASOS ABS: 0 10*3/uL (ref 0.0–0.1)
BASOS PCT: 1 % (ref 0–1)
Eosinophils Absolute: 0.1 10*3/uL (ref 0.0–0.7)
Eosinophils Relative: 2 % (ref 0–5)
HEMATOCRIT: 39.2 % (ref 36.0–46.0)
Hemoglobin: 13.5 g/dL (ref 12.0–15.0)
Lymphocytes Relative: 38 % (ref 12–46)
Lymphs Abs: 1.5 10*3/uL (ref 0.7–4.0)
MCH: 30.9 pg (ref 26.0–34.0)
MCHC: 34.4 g/dL (ref 30.0–36.0)
MCV: 89.7 fL (ref 78.0–100.0)
MONO ABS: 0.4 10*3/uL (ref 0.1–1.0)
MPV: 8.6 fL (ref 8.6–12.4)
Monocytes Relative: 9 % (ref 3–12)
NEUTROS ABS: 2 10*3/uL (ref 1.7–7.7)
Neutrophils Relative %: 50 % (ref 43–77)
Platelets: 262 10*3/uL (ref 150–400)
RBC: 4.37 MIL/uL (ref 3.87–5.11)
RDW: 12.7 % (ref 11.5–15.5)
WBC: 4 10*3/uL (ref 4.0–10.5)

## 2014-03-31 LAB — COMPREHENSIVE METABOLIC PANEL
ALBUMIN: 4.4 g/dL (ref 3.5–5.2)
ALK PHOS: 88 U/L (ref 39–117)
ALT: 19 U/L (ref 0–35)
AST: 22 U/L (ref 0–37)
BILIRUBIN TOTAL: 1 mg/dL (ref 0.2–1.2)
BUN: 10 mg/dL (ref 6–23)
CO2: 26 meq/L (ref 19–32)
CREATININE: 0.53 mg/dL (ref 0.50–1.10)
Calcium: 10.1 mg/dL (ref 8.4–10.5)
Chloride: 104 mEq/L (ref 96–112)
GLUCOSE: 84 mg/dL (ref 70–99)
POTASSIUM: 3.8 meq/L (ref 3.5–5.3)
SODIUM: 141 meq/L (ref 135–145)
TOTAL PROTEIN: 7.3 g/dL (ref 6.0–8.3)

## 2014-03-31 LAB — LIPID PANEL
CHOL/HDL RATIO: 4 ratio
CHOLESTEROL: 198 mg/dL (ref 0–200)
HDL: 49 mg/dL (ref >39)
LDL Cholesterol: 112 mg/dL — ABNORMAL HIGH (ref 0–99)
TRIGLYCERIDES: 186 mg/dL — AB (ref <150)
VLDL: 37 mg/dL (ref 0–40)

## 2014-03-31 LAB — HEPATITIS C ANTIBODY: HCV AB: NEGATIVE

## 2014-03-31 NOTE — Progress Notes (Signed)
Megan Lam 03/06/1955 [id number]    History:    Presents for annual exam.  Supracervical TAH for fibroids on no HRT. Normal Pap and mammogram history. Not sexually active. Normal Pap at primary care last year. 01/2013 virtual colonoscopy no polyps.  Past medical history, past surgical history, family history and social history were all reviewed and documented in the EPIC chart. Works at the [employer]. 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 Father diabetes and hypertension.  ROS:  A ROS was performed and pertinent positives and negatives are included.  Exam:  Filed Vitals:   03/31/14 0833  BP: 119/75    General appearance:  Normal Thyroid:  Symmetrical, normal in size, without palpable masses or nodularity. Respiratory  Auscultation:  Clear without wheezing or rhonchi Cardiovascular  Auscultation:  Regular rate, without rubs, murmurs or gallops  Edema/varicosities:  Not grossly evident Abdominal  Soft,nontender, without masses, guarding or rebound.  Liver/spleen:  No organomegaly noted  Hernia:  None appreciated  Skin  Inspection:  Grossly normal   Breasts: Examined lying and sitting.     Right: Without masses, retractions, discharge or axillary adenopathy.     Left: Without masses, retractions, discharge or axillary adenopathy. Gentitourinary   Inguinal/mons:  Normal without inguinal adenopathy  External genitalia:  Normal  BUS/Urethra/Skene's glands:  Normal  Vagina:  Atrophic  Cervix:  Normal  Uterus:  Absent  Adnexa/parametria:     Rt: Without masses or tenderness.   Lt: Without masses or tenderness.  Anus and perineum: Normal  Digital rectal exam: Normal sphincter tone without palpated masses or tenderness  Assessment/Plan:  59 y.o. S WF G0  for annual exam.     Postmenopausal/no HRT/not not sexually active Asymptomatic vaginal atrophy Possible lead paint exposure  Plan: SBE's, continue annual screening 3-D tomography  history of dense breasts. Encouraged regular exercise, calcium rich diet, vitamin D 2000 daily encouraged. Home safety, fall prevention discussed. Had a normal DEXA at primary care instructed to repeat year. CBC, CMP, vitamin D, lipid panel, UA, screening guidelines for pap reviewed. Condoms encouraged if sexually active. Declines treatment for vaginal atrophy.    Harrington ChallengerYOUNG,Dlisa Barnwell J [facility] LLCWHNP, 9:09 AM 03/31/2014

## 2014-03-31 NOTE — Patient Instructions (Signed)
Health Recommendations for Postmenopausal Women Respected and ongoing research has looked at the most common causes of death, disability, and poor quality of life in postmenopausal women. The causes include heart disease, diseases of blood vessels, diabetes, depression, cancer, and bone loss (osteoporosis). Many things can be done to help lower the chances of developing these and other common problems. CARDIOVASCULAR DISEASE Heart Disease: A heart attack is a medical emergency. Know the signs and symptoms of a heart attack. Below are things women can do to reduce their risk for heart disease.   Do not smoke. If you smoke, quit.  Aim for a healthy weight. Being overweight causes many preventable deaths. Eat a healthy and balanced diet and drink an adequate amount of liquids.  Get moving. Make a commitment to be more physically active. Aim for 30 minutes of activity on most, if not all days of the week.  Eat for heart health. Choose a diet that is low in saturated fat and cholesterol and eliminate trans fat. Include whole grains, vegetables, and fruits. Read and understand the labels on food containers before buying.  Know your numbers. Ask your caregiver to check your blood pressure, cholesterol (total, HDL, LDL, triglycerides) and blood glucose. Work with your caregiver on improving your entire clinical picture.  High blood pressure. Limit or stop your table salt intake (try salt substitute and food seasonings). Avoid salty foods and drinks. Read labels on food containers before buying. Eating well and exercising can help control high blood pressure. STROKE  Stroke is a medical emergency. Stroke may be the result of a blood clot in a blood vessel in the brain or by a brain hemorrhage (bleeding). Know the signs and symptoms of a stroke. To lower the risk of developing a stroke:  Avoid fatty foods.  Quit smoking.  Control your diabetes, blood pressure, and irregular heart rate. THROMBOPHLEBITIS  (BLOOD CLOT) OF THE LEG  Becoming overweight and leading a stationary lifestyle may also contribute to developing blood clots. Controlling your diet and exercising will help lower the risk of developing blood clots. CANCER SCREENING  Breast Cancer: Take steps to reduce your risk of breast cancer.  You should practice "breast self-awareness." This means understanding the normal appearance and feel of your breasts and should include breast self-examination. Any changes detected, no matter how small, should be reported to your caregiver.  After age 40, you should have a clinical breast exam (CBE) every year.  Starting at age 40, you should consider having a mammogram (breast X-ray) every year.  If you have a family history of breast cancer, talk to your caregiver about genetic screening.  If you are at high risk for breast cancer, talk to your caregiver about having an MRI and a mammogram every year.  Intestinal or Stomach Cancer: Tests to consider are a rectal exam, fecal occult blood, sigmoidoscopy, and colonoscopy. Women who are high risk may need to be screened at an earlier age and more often.  Cervical Cancer:  Beginning at age 30, you should have a Pap test every 3 years as long as the past 3 Pap tests have been normal.  If you have had past treatment for cervical cancer or a condition that could lead to cancer, you need Pap tests and screening for cancer for at least 20 years after your treatment.  If you had a hysterectomy for a problem that was not cancer or a condition that could lead to cancer, then you no longer need Pap tests.    If you are between ages 65 and 70, and you have had normal Pap tests going back 10 years, you no longer need Pap tests.  If Pap tests have been discontinued, risk factors (such as a new sexual partner) need to be reassessed to determine if screening should be resumed.  Some medical problems can increase the chance of getting cervical cancer. In these  cases, your caregiver may recommend more frequent screening and Pap tests.  Uterine Cancer: If you have vaginal bleeding after reaching menopause, you should notify your caregiver.  Ovarian Cancer: Other than yearly pelvic exams, there are no reliable tests available to screen for ovarian cancer at this time except for yearly pelvic exams.  Lung Cancer: Yearly chest X-rays can detect lung cancer and should be done on high risk women, such as cigarette smokers and women with chronic lung disease (emphysema).  Skin Cancer: A complete body skin exam should be done at your yearly examination. Avoid overexposure to the sun and ultraviolet light lamps. Use a strong sun block cream when in the sun. All of these things are important for lowering the risk of skin cancer. MENOPAUSE Menopause Symptoms: Hormone therapy products are effective for treating symptoms associated with menopause:  Moderate to severe hot flashes.  Night sweats.  Mood swings.  Headaches.  Tiredness.  Loss of sex drive.  Insomnia.  Other symptoms. Hormone replacement carries certain risks, especially in older women. Women who use or are thinking about using estrogen or estrogen with progestin treatments should discuss that with their caregiver. Your caregiver will help you understand the benefits and risks. The ideal dose of hormone replacement therapy is not known. The Food and Drug Administration (FDA) has concluded that hormone therapy should be used only at the lowest doses and for the shortest amount of time to reach treatment goals.  OSTEOPOROSIS Protecting Against Bone Loss and Preventing Fracture If you use hormone therapy for prevention of bone loss (osteoporosis), the risks for bone loss must outweigh the risk of the therapy. Ask your caregiver about other medications known to be safe and effective for preventing bone loss and fractures. To guard against bone loss or fractures, the following is recommended:  If  you are younger than age 50, take 1000 mg of calcium and at least 600 mg of Vitamin D per day.  If you are older than age 50 but younger than age 70, take 1200 mg of calcium and at least 600 mg of Vitamin D per day.  If you are older than age 70, take 1200 mg of calcium and at least 800 mg of Vitamin D per day. Smoking and excessive alcohol intake increases the risk of osteoporosis. Eat foods rich in calcium and vitamin D and do weight bearing exercises several times a week as your caregiver suggests. DIABETES Diabetes Mellitus: If you have type I or type 2 diabetes, you should keep your blood sugar under control with diet, exercise, and recommended medication. Avoid starchy and fatty foods, and too many sweets. Being overweight can make diabetes control more difficult. COGNITION AND MEMORY Cognition and Memory: Menopausal hormone therapy is not recommended for the prevention of cognitive disorders such as Alzheimer's disease or memory loss.  DEPRESSION  Depression may occur at any age, but it is common in elderly women. This may be because of physical, medical, social (loneliness), or financial problems and needs. If you are experiencing depression because of medical problems and control of symptoms, talk to your caregiver about this. Physical   activity and exercise may help with mood and sleep. Community and volunteer involvement may improve your sense of value and worth. If you have depression and you feel that the problem is getting worse or becoming severe, talk to your caregiver about which treatment options are best for you. ACCIDENTS  Accidents are common and can be serious in elderly woman. Prepare your house to prevent accidents. Eliminate throw rugs, place hand bars in bath, shower, and toilet areas. Avoid wearing high heeled shoes or walking on wet, snowy, and icy areas. Limit or stop driving if you have vision or hearing problems, or if you feel you are unsteady with your movements and  reflexes. HEPATITIS C Hepatitis C is a type of viral infection affecting the liver. It is spread mainly through contact with blood from an infected person. It can be treated, but if left untreated, it can lead to severe liver damage over the years. Many people who are infected do not know that the virus is in their blood. If you are a "baby-boomer", it is recommended that you have one screening test for Hepatitis C. IMMUNIZATIONS  Several immunizations are important to consider having during your senior years, including:   Tetanus, diphtheria, and pertussis booster shot.  Influenza every year before the flu season begins.  Pneumonia vaccine.  Shingles vaccine.  Others, as indicated based on your specific needs. Talk to your caregiver about these. Document Released: 04/06/2005 Document Revised: 06/29/2013 Document Reviewed: 12/01/2007 ExitCare Patient Information 2015 [employer]. This information is not intended to replace advice given to you by your health care provider. Make sure you discuss any questions you have with your health care provider.  

## 2014-04-01 LAB — URINE CULTURE
COLONY COUNT: NO GROWTH
ORGANISM ID, BACTERIA: NO GROWTH

## 2014-04-01 LAB — LEAD, BLOOD

## 2014-04-01 LAB — VITAMIN D 25 HYDROXY (VIT D DEFICIENCY, FRACTURES): VIT D 25 HYDROXY: 28 ng/mL — AB (ref 30–100)

## 2014-04-14 ENCOUNTER — Encounter: Payer: Self-pay | Admitting: Gynecology

## 2014-04-15 ENCOUNTER — Ambulatory Visit: Payer: Federal, State, Local not specified - PPO | Admitting: Gynecology

## 2014-09-03 IMAGING — CR DG FOOT COMPLETE 3+V*L*
2 series · 2 of 2 positions shown · non-contrast
Comparison: None.

CLINICAL DATA: Fell and injured left foot.

LEFT FOOT - COMPLETE 3+ VIEW

[AP]
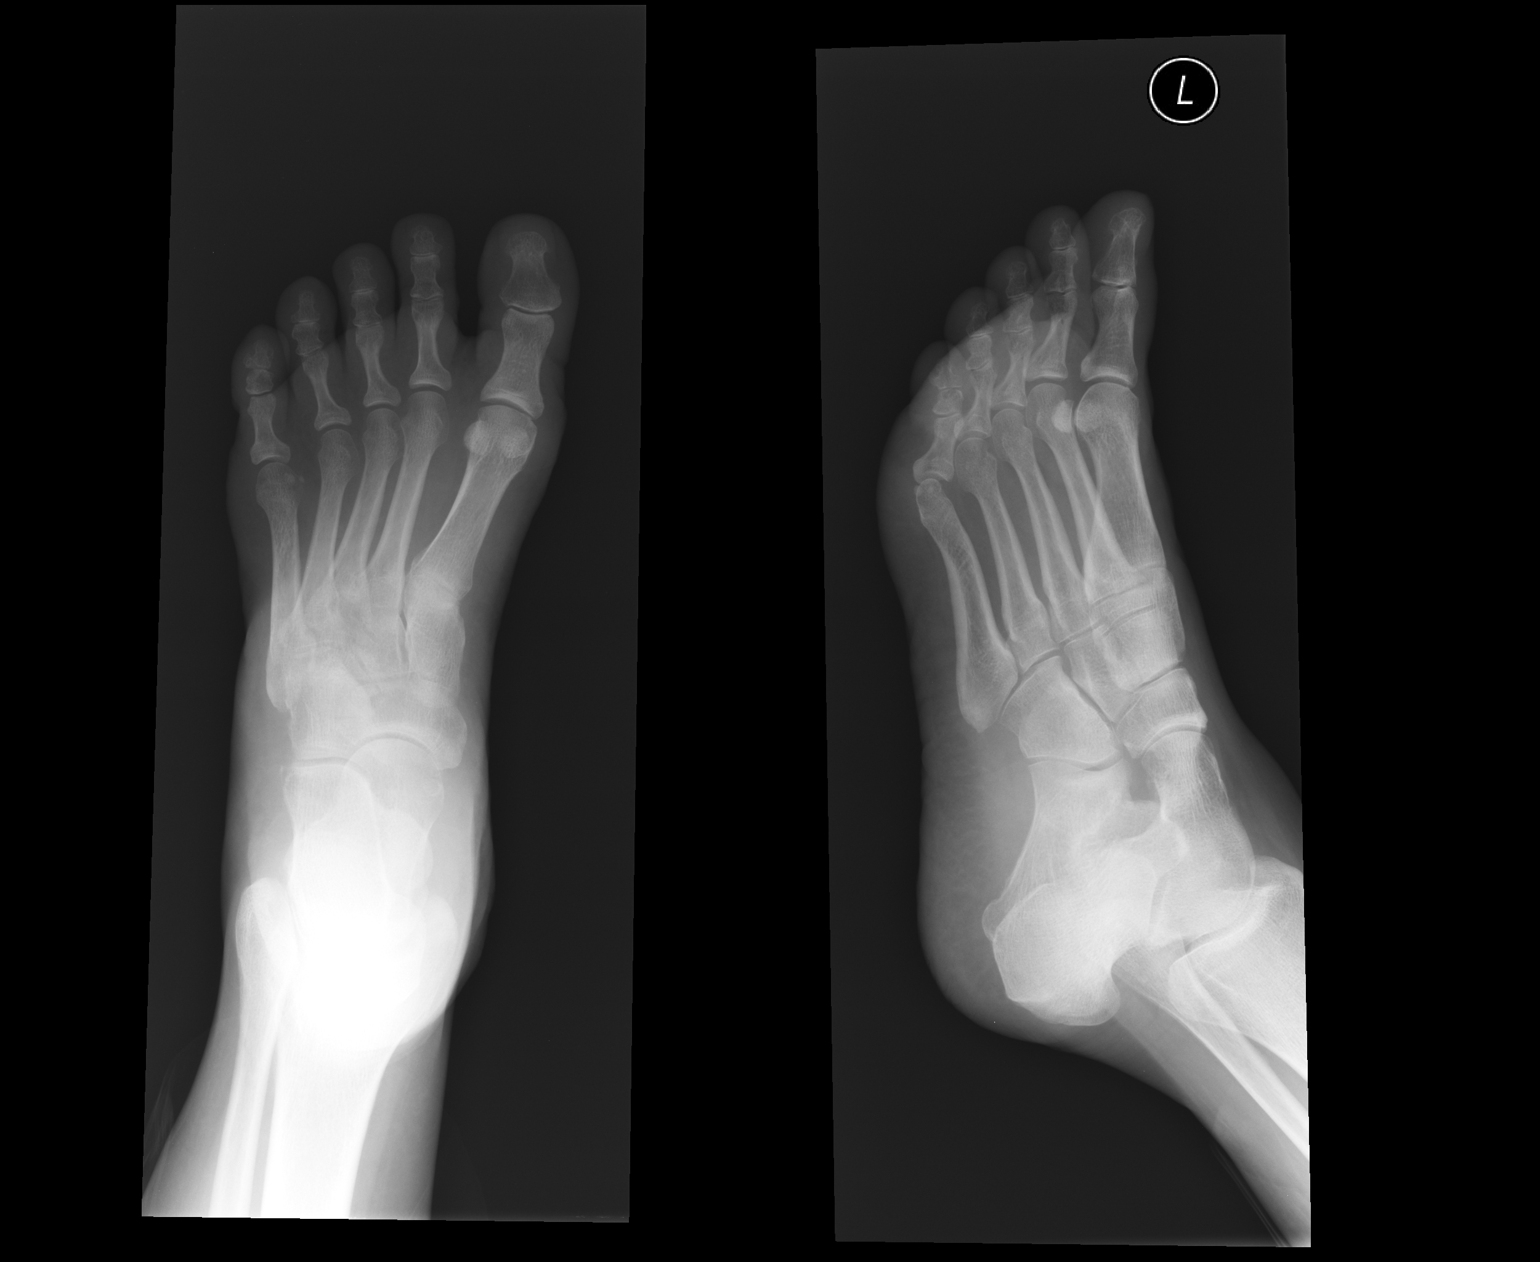

[lateral]
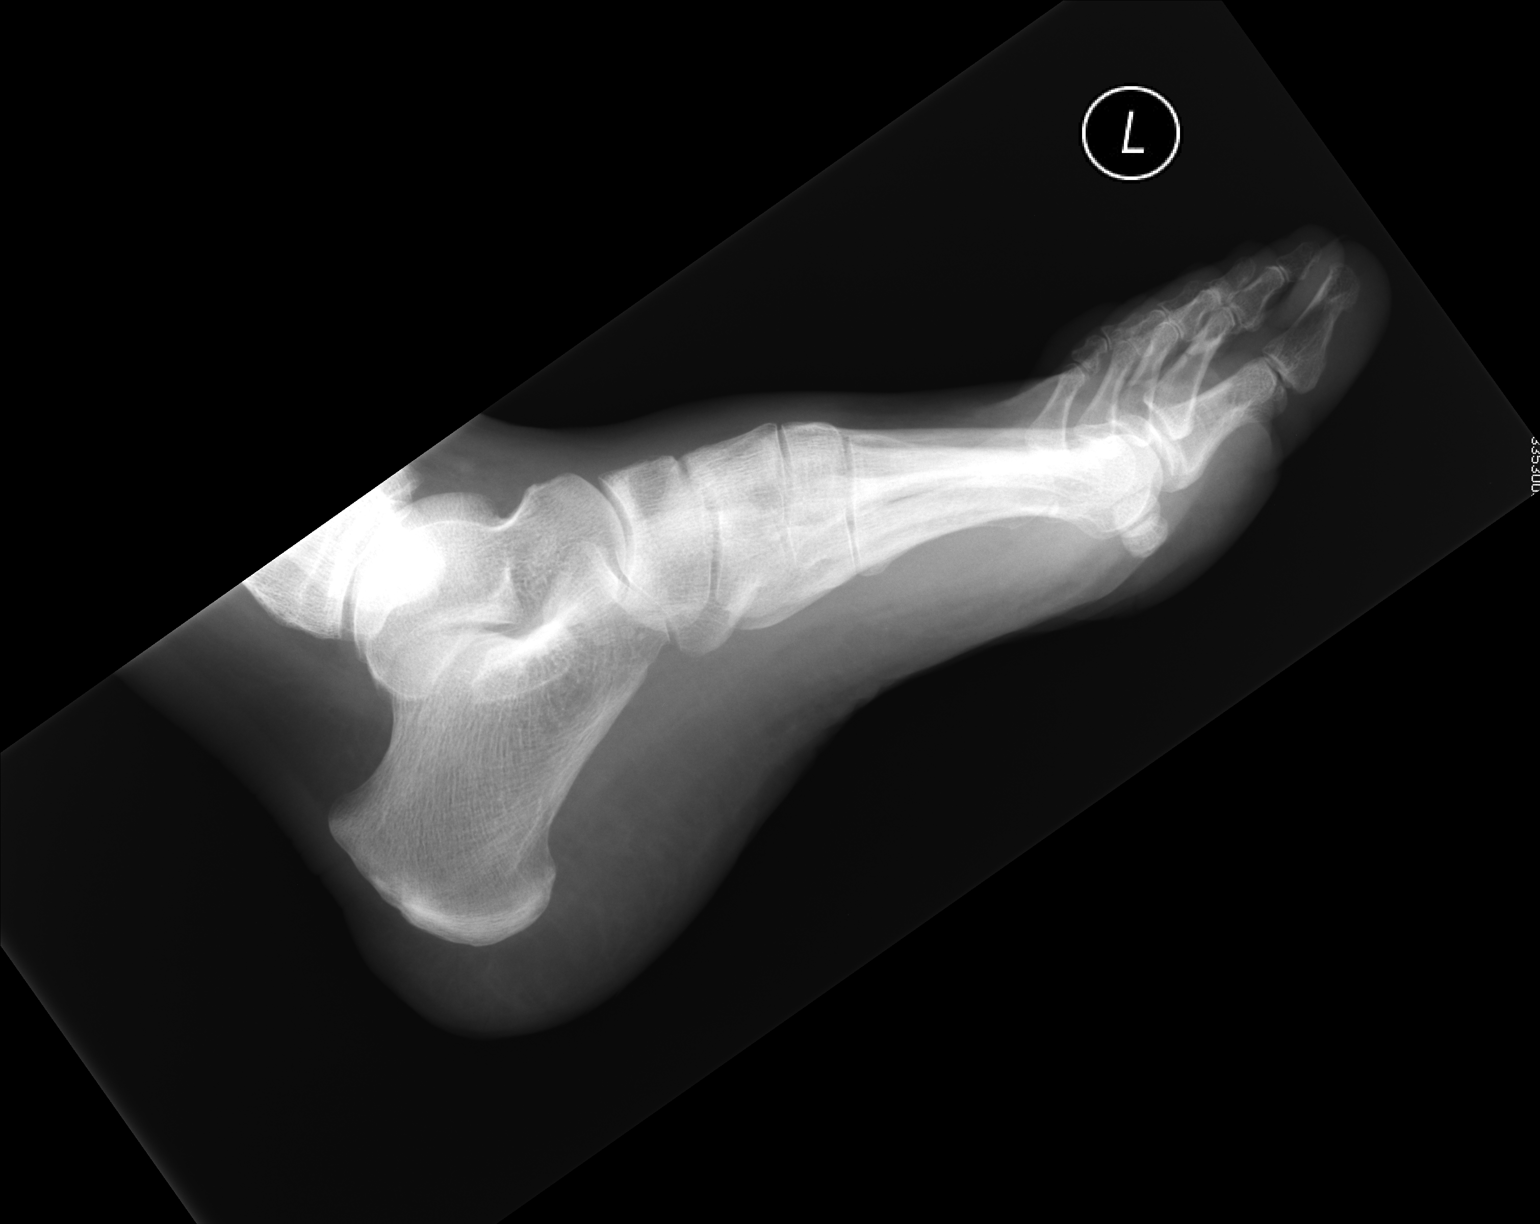

[2 of 2 positions shown; findings below may reference images not displayed]

FINDINGS: Possible small avulsion fracture arising from the lateral
aspect of the distal pole of the calcaneus at its articulation with
the cuboid bone.  No evidence of acute fracture elsewhere.  Well-
preserved joint spaces.   Well-preserved bone mineral density.
IMPRESSION: Possible avulsion fracture arising from the lateral aspect of the
distal calcaneus at its articulation with the cuboid bone.  Please
correlate with point tenderness.  No other fractures.

Clinically significant discrepancy from primary report, if
provided: This was not mentioned by Dr. Am-Ahmad on his original
report.

These results were called by telephone on 10/18/2012 at 7873 hours
(the time the images became available for interpretation) to
acknowledged these results.

## 2015-02-22 ENCOUNTER — Encounter: Payer: Self-pay | Admitting: Gynecology

## 2015-04-08 ENCOUNTER — Other Ambulatory Visit (HOSPITAL_COMMUNITY)
Admission: RE | Admit: 2015-04-08 | Discharge: 2015-04-08 | Disposition: A | Discharge status: Home / Self Care | Payer: Federal, State, Local not specified - PPO | Source: Ambulatory Visit | Attending: Women's Health | Admitting: Women's Health

## 2015-04-08 ENCOUNTER — Ambulatory Visit (INDEPENDENT_AMBULATORY_CARE_PROVIDER_SITE_OTHER): Payer: Federal, State, Local not specified - PPO | Admitting: Women's Health

## 2015-04-08 ENCOUNTER — Encounter: Payer: Self-pay | Admitting: Women's Health

## 2015-04-08 VITALS — BP 119/76 | Ht 65.0 in | Wt 139.8 lb

## 2015-04-08 DIAGNOSIS — Z01419 Encounter for gynecological examination (general) (routine) without abnormal findings: Secondary | ICD-10-CM | POA: Insufficient documentation

## 2015-04-08 DIAGNOSIS — Z1329 Encounter for screening for other suspected endocrine disorder: Secondary | ICD-10-CM | POA: Diagnosis not present

## 2015-04-08 DIAGNOSIS — Z13820 Encounter for screening for osteoporosis: Secondary | ICD-10-CM

## 2015-04-08 DIAGNOSIS — Z1151 Encounter for screening for human papillomavirus (HPV): Secondary | ICD-10-CM | POA: Insufficient documentation

## 2015-04-08 DIAGNOSIS — Z13220 Encounter for screening for lipoid disorders: Secondary | ICD-10-CM

## 2015-04-08 LAB — COMPREHENSIVE METABOLIC PANEL
ALBUMIN: 4 g/dL (ref 3.6–5.1)
ALT: 14 U/L (ref 6–29)
AST: 18 U/L (ref 10–35)
Alkaline Phosphatase: 83 U/L (ref 33–130)
BUN: 7 mg/dL (ref 7–25)
CHLORIDE: 103 mmol/L (ref 98–110)
CO2: 27 mmol/L (ref 20–31)
Calcium: 9.4 mg/dL (ref 8.6–10.4)
Creat: 0.51 mg/dL (ref 0.50–1.05)
Glucose, Bld: 95 mg/dL (ref 65–99)
POTASSIUM: 3.6 mmol/L (ref 3.5–5.3)
SODIUM: 140 mmol/L (ref 135–146)
TOTAL PROTEIN: 6.8 g/dL (ref 6.1–8.1)
Total Bilirubin: 0.9 mg/dL (ref 0.2–1.2)

## 2015-04-08 LAB — CBC WITH DIFFERENTIAL/PLATELET
BASOS PCT: 2 % — AB (ref 0–1)
Basophils Absolute: 0.1 10*3/uL (ref 0.0–0.1)
EOS ABS: 0.1 10*3/uL (ref 0.0–0.7)
EOS PCT: 3 % (ref 0–5)
HCT: 38.2 % (ref 36.0–46.0)
Hemoglobin: 13.1 g/dL (ref 12.0–15.0)
LYMPHS ABS: 1.6 10*3/uL (ref 0.7–4.0)
Lymphocytes Relative: 40 % (ref 12–46)
MCH: 31.1 pg (ref 26.0–34.0)
MCHC: 34.3 g/dL (ref 30.0–36.0)
MCV: 90.7 fL (ref 78.0–100.0)
MONO ABS: 0.4 10*3/uL (ref 0.1–1.0)
MONOS PCT: 9 % (ref 3–12)
MPV: 8.2 fL — ABNORMAL LOW (ref 8.6–12.4)
NEUTROS PCT: 46 % (ref 43–77)
Neutro Abs: 1.8 10*3/uL (ref 1.7–7.7)
PLATELETS: 258 10*3/uL (ref 150–400)
RBC: 4.21 MIL/uL (ref 3.87–5.11)
RDW: 13.6 % (ref 11.5–15.5)
WBC: 3.9 10*3/uL — ABNORMAL LOW (ref 4.0–10.5)

## 2015-04-08 LAB — LIPID PANEL
CHOL/HDL RATIO: 4.3 ratio (ref <=5.0)
CHOLESTEROL: 172 mg/dL (ref 125–200)
HDL: 40 mg/dL — ABNORMAL LOW (ref >=46)
LDL Cholesterol: 107 mg/dL (ref <130)
TRIGLYCERIDES: 124 mg/dL (ref <150)
VLDL: 25 mg/dL (ref <30)

## 2015-04-08 LAB — TSH: TSH: 1.39 m[IU]/L

## 2015-04-08 NOTE — Progress Notes (Signed)
Megan Lam-[birth year] [id number]    History:    Presents for annual exam.  2011TAH supracervical, for fibroids LSO for benign cyst on no HRT. [place]. Normal Pap and mammogram history. 2014 virtual colonoscopy without polyps.  Past medical history, past surgical history, family history and social history were all reviewed and documented in the EPIC chart. 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 Father and poor health with diabetes and hypertension.  ROS:  A ROS was performed and pertinent positives and negatives are included.  Exam:  Filed Vitals:   04/08/15 0945  BP: 119/76    General appearance:  Normal Thyroid:  Symmetrical, normal in size, without palpable masses or nodularity. Respiratory  Auscultation:  Clear without wheezing or rhonchi Cardiovascular  Auscultation:  Regular rate, without rubs, murmurs or gallops  Edema/varicosities:  Not grossly evident Abdominal  Soft,nontender, without masses, guarding or rebound.  Liver/spleen:  No organomegaly noted  Hernia:  None appreciated  Skin  Inspection:  Grossly normal   Breasts: Examined lying and sitting.     Right: Without masses, retractions, discharge or axillary adenopathy.     Left: Without masses, retractions, discharge or axillary adenopathy. Gentitourinary   Inguinal/mons:  Normal without inguinal adenopathy  External genitalia:  Normal  BUS/Urethra/Skene's glands:  Normal  Vagina:  Normal  Cervix:  Normal  Uterus:  Absent  Adnexa/parametria:     Rt: Without masses or tenderness.   Lt: Without masses or tenderness.  Anus and perineum: Normal  Digital rectal exam: Normal sphincter tone without palpated masses or tenderness  Assessment/Plan:  60 y.o. S WF [place] for annual exam with no complaints.  TAH supracervical  for fibroids on no HRT  Plan: SBE's, continue annual 3-D screening mammogram history of dense breasts. Continue active lifestyle, regular exercise, calcium  rich diet, vitamin D 2000 daily encouraged. DEXA, reviewed importance of home safety and fall prevention. CBC, CMP, lipid panel, vitamin D, UA, Pap HR HPV typing. Vaginal lubricants encouraged and condoms if sexually active.    Harrington Challenger WHNP, 11:02 AM 04/08/2015

## 2015-04-08 NOTE — Patient Instructions (Signed)

## 2015-04-09 LAB — VITAMIN D 25 HYDROXY (VIT D DEFICIENCY, FRACTURES): Vit D, 25-Hydroxy: 28 ng/mL — ABNORMAL LOW (ref 30–100)

## 2015-04-12 LAB — CYTOLOGY - PAP

## 2015-04-14 ENCOUNTER — Telehealth: Payer: Self-pay | Admitting: *Deleted

## 2015-04-14 NOTE — Telephone Encounter (Signed)
Telephone call, states area is tender, feels more firm, no change in color, circulation. Denies fever. Has follow-up with leg specialist will discuss then. Reviewed best for [place] not to do an MRI/not GYN related.

## 2015-04-14 NOTE — Telephone Encounter (Signed)
Pt called c/o painful fatty tissue on her upper thigh, requesting MRI, states she spoke with you about this fatty tissue  before? I didn't see anything in notes about this, do you suggest patient follow up with PCP? Pt said another doctor order x-ray last year and it fine, she could remember who the physician was. Please advise

## 2015-04-27 DIAGNOSIS — M8580 Other specified disorders of bone density and structure, unspecified site: Secondary | ICD-10-CM

## 2015-04-27 HISTORY — DX: Other specified disorders of bone density and structure, unspecified site: M85.80

## 2015-04-28 ENCOUNTER — Ambulatory Visit (INDEPENDENT_AMBULATORY_CARE_PROVIDER_SITE_OTHER): Payer: Federal, State, Local not specified - PPO

## 2015-04-28 ENCOUNTER — Other Ambulatory Visit: Payer: Self-pay | Admitting: Women's Health

## 2015-04-28 ENCOUNTER — Telehealth: Payer: Self-pay | Admitting: Gynecology

## 2015-04-28 ENCOUNTER — Encounter: Payer: Self-pay | Admitting: Gynecology

## 2015-04-28 ENCOUNTER — Other Ambulatory Visit: Payer: Self-pay | Admitting: Gynecology

## 2015-04-28 DIAGNOSIS — Z13820 Encounter for screening for osteoporosis: Secondary | ICD-10-CM | POA: Diagnosis not present

## 2015-04-28 DIAGNOSIS — M899 Disorder of bone, unspecified: Secondary | ICD-10-CM

## 2015-04-28 DIAGNOSIS — E559 Vitamin D deficiency, unspecified: Secondary | ICD-10-CM

## 2015-04-28 DIAGNOSIS — M8580 Other specified disorders of bone density and structure, unspecified site: Secondary | ICD-10-CM

## 2015-04-28 NOTE — Telephone Encounter (Signed)
Tell patient that her bone density does show osteopenia with some bone loss. Not to the extent that I would want to put her on medication specifically but she needs to make sure she is taking her extra vitamin D. Her most recent level was low and she was called and told to increase her intake. I would recommend repeating her vitamin D in 3 months to make sure that we are in the therapeutic range. Otherwise I would recommend repeating the bone density in 2 years.

## 2015-04-29 NOTE — Telephone Encounter (Signed)
Patient has question about her wrist was the bone loss at this site?

## 2015-05-02 NOTE — Telephone Encounter (Signed)
Message left

## 2015-05-03 ENCOUNTER — Other Ambulatory Visit: Payer: Self-pay | Admitting: Women's Health

## 2015-05-03 DIAGNOSIS — E559 Vitamin D deficiency, unspecified: Secondary | ICD-10-CM

## 2015-05-03 MED ORDER — VITAMIN D (ERGOCALCIFEROL) 1.25 MG (50000 UNIT) PO CAPS: 50000.0000 [IU] | ORAL_CAPSULE | ORAL | Status: AC

## 2015-05-03 NOTE — Telephone Encounter (Signed)
Megan Lam patient called back to speak with you, [phone]

## 2015-05-03 NOTE — Telephone Encounter (Signed)
Phone call to review results reviewed DEXA stable continue weightbearing exercise will take vitamin D 50,000 weekly for 12 weeks and recheck, vitamin D was 28. Reviewed forearm -1.8 stable. Wrist is not done.

## 2015-05-27 ENCOUNTER — Ambulatory Visit: Payer: Federal, State, Local not specified - PPO | Admitting: Podiatry

## 2015-06-29 ENCOUNTER — Other Ambulatory Visit: Payer: Self-pay | Admitting: *Deleted

## 2015-06-29 DIAGNOSIS — I83893 Varicose veins of bilateral lower extremities with other complications: Secondary | ICD-10-CM

## 2015-07-01 ENCOUNTER — Encounter: Payer: Self-pay | Admitting: Vascular Surgery

## 2015-07-06 ENCOUNTER — Ambulatory Visit (INDEPENDENT_AMBULATORY_CARE_PROVIDER_SITE_OTHER): Payer: Federal, State, Local not specified - PPO | Admitting: Vascular Surgery

## 2015-07-06 ENCOUNTER — Encounter: Payer: Self-pay | Admitting: *Deleted

## 2015-07-06 ENCOUNTER — Encounter: Payer: Self-pay | Admitting: Vascular Surgery

## 2015-07-06 ENCOUNTER — Ambulatory Visit (HOSPITAL_COMMUNITY)
Admission: RE | Admit: 2015-07-06 | Discharge: 2015-07-06 | Disposition: A | Discharge status: Home / Self Care | Payer: Federal, State, Local not specified - PPO | Source: Ambulatory Visit | Attending: Vascular Surgery | Admitting: Vascular Surgery

## 2015-07-06 VITALS — BP 134/81 | HR 76 | Ht 65.0 in | Wt 140.8 lb

## 2015-07-06 DIAGNOSIS — I872 Venous insufficiency (chronic) (peripheral): Secondary | ICD-10-CM | POA: Diagnosis not present

## 2015-07-06 DIAGNOSIS — I83893 Varicose veins of bilateral lower extremities with other complications: Secondary | ICD-10-CM | POA: Diagnosis not present

## 2015-07-06 DIAGNOSIS — I8391 Asymptomatic varicose veins of right lower extremity: Secondary | ICD-10-CM | POA: Insufficient documentation

## 2015-07-06 NOTE — Progress Notes (Signed)
[facility]  Patient name: Megan Lam MRN: [id number] DOB: [birth year] Sex: female  REASON FOR CONSULT: bilateral varicose veins with leg pain. Self referral.  HPI: Megan Lam is a 60 y.o. female, who is referred with bilateral varicose veins and leg pain. The patient works at the [employer] and spends a lot of time standing. She experiences significant aching pain and heaviness in both lower extremities. Her symptoms are aggravated by standing and relieved with leg elevation. She's also had significant leg swelling. She noticed spider veins in both legs and was concerned and comes for venous evaluation.  I do not get any history of claudication or rest pain.   She does describe some occasional chest pressure but has no real risk factors for heart problems. I have asked her to follow up with her primary care physician concerning this issue.  Past Medical History  Diagnosis Date  . Allergy   . Arthritis   . Osteopenia 04/2015    T score -1.8 FRAX 14%/1.8%    Family History  Problem Relation Age of Onset  . Heart disease Father   . Diabetes Father     SOCIAL HISTORY: Social History   Social History  . Marital Status: Single    Spouse Name: N/A  . Number of Children: N/A  . Years of Education: N/A   Occupational History  . Not on file.   Social History Main Topics  . Smoking status: Never Smoker   . Smokeless tobacco: Not on file  . Alcohol Use: No  . Drug Use: No  . Sexual Activity: Not Currently   Other Topics Concern  . Not on file   Social History Narrative    No Known Allergies  Current Outpatient Prescriptions  Medication Sig Dispense Refill  . cholecalciferol (VITAMIN D) 1000 UNITS tablet Take 1,000 Units by mouth daily.    . diphenhydrAMINE (BENADRYL) 25 MG tablet Take 2 tablets (50 mg total) by mouth every 4 (four) hours as needed for itching. 20 tablet 0  . ibuprofen (ADVIL,MOTRIN) 200 MG tablet Take 200-400 mg by  mouth every 6 (six) hours as needed for pain.    . Multiple Vitamin (MULTIVITAMIN WITH MINERALS) TABS Take 1 tablet by mouth daily.    . Vitamin D, Ergocalciferol, (DRISDOL) 50000 units CAPS capsule Take 1 capsule (50,000 Units total) by mouth every 7 (seven) days. Take 1 every 7 days 12 capsule 1   No current facility-administered medications for this visit.    REVIEW OF SYSTEMS:   denotes positive finding,  denotes negative finding Cardiac  Comments:  Chest pain or chest pressure: X After occasional chest pressure  Shortness of breath upon exertion:    Short of breath when lying flat:    Irregular heart rhythm:        Vascular    Pain in calf, thigh, or hip brought on by ambulation:    Pain in feet at night that wakes you up from your sleep:     Blood clot in your veins:    Leg swelling:         Pulmonary    Oxygen at home:    Productive cough:     Wheezing:         Neurologic    Sudden weakness in arms or legs:     Sudden numbness in arms or legs:     Sudden onset of difficulty speaking or slurred speech:    Temporary loss  of vision in one eye:     Problems with dizziness:         Gastrointestinal    Blood in stool:     Vomited blood:         Genitourinary    Burning when urinating:     Blood in urine:        Psychiatric    Major depression:         Hematologic    Bleeding problems:    Problems with blood clotting too easily:        Skin    Rashes or ulcers:        Constitutional    Fever or chills:      PHYSICAL EXAM: Filed Vitals:   07/06/15 1307 07/06/15 1317  BP: 145/82 134/81  Pulse: 76   Height: 5\' 5"  (1.651 m)   Weight: 140 lb 12.8 oz (63.866 kg)   SpO2: 100%     GENERAL: The patient is a well-nourished female, in no acute distress. The vital signs are documented above. CARDIAC: There is a regular rate and rhythm.  VASCULAR: I do not detect carotid bruits. She has palpable femoral and pedal pulses bilaterally. PULMONARY: There is  good air exchange bilaterally without wheezing or rales. ABDOMEN: Soft and non-tender with normal pitched bowel sounds.  MUSCULOSKELETAL: There are no major deformities or cyanosis. NEUROLOGIC: No focal weakness or paresthesias are detected. SKIN: she has spider veins and telangiectasias in both lower extremities in the medial calf and posterior popliteal fossa bilaterally. PSYCHIATRIC: The patient has a normal affect.  DATA:   LOWER EXTREMITY VENOUS DUPLEX:  I have independently interpreted her lower extremity venous duplex.  On the right side, there is no evidence of DVT or superficial thrombophlebitis. There is reflux in the deep system involving the common femoral vein. There is no reflux in the great saphenous vein.  On the left side there is no evidence of DVT or superficial thrombophlebitis. There is no reflux in the deep system or the great saphenous vein on the left.  MEDICAL ISSUES:  CHRONIC VENOUS INSUFFICIENCY: This patient does have some chronic venous insufficiency with reflux in the common femoral vein on the right. We have discussed the importance of intermittent leg elevation in the proper positioning for this. In addition I have written her a prescription for knee-high compression stockings with a gradient of 15-20 mmHg. I have encouraged her to try to avoid prolonged sitting and standing. I have encouraged her to remain as active as possible. We have also discussed the potential use of water aerobics. Does have some spider veins bilaterally and would be a candidate for sclerotherapy if she elected to do this. I will be happy to see her back at anytime if any new vascular issues arise.  Waverly Ferrariickson, [facility] [place]: [phone]

## 2015-08-18 ENCOUNTER — Encounter: Payer: Self-pay | Admitting: Gynecology

## 2015-08-18 ENCOUNTER — Ambulatory Visit (INDEPENDENT_AMBULATORY_CARE_PROVIDER_SITE_OTHER): Payer: Federal, State, Local not specified - PPO | Admitting: Gynecology

## 2015-08-18 ENCOUNTER — Ambulatory Visit (HOSPITAL_COMMUNITY)
Admission: RE | Admit: 2015-08-18 | Discharge: 2015-08-18 | Disposition: A | Discharge status: Home / Self Care | Payer: Federal, State, Local not specified - PPO | Source: Ambulatory Visit | Attending: Gynecology | Admitting: Gynecology

## 2015-08-18 VITALS — BP 124/80 | Ht 65.0 in | Wt 140.0 lb

## 2015-08-18 DIAGNOSIS — N6489 Other specified disorders of breast: Secondary | ICD-10-CM

## 2015-08-18 DIAGNOSIS — N63 Unspecified lump in breast: Secondary | ICD-10-CM

## 2015-08-18 DIAGNOSIS — N6320 Unspecified lump in the left breast, unspecified quadrant: Secondary | ICD-10-CM

## 2015-08-18 MED ORDER — TRAMADOL HCL 50 MG PO TABS: 50.0000 mg | ORAL_TABLET | Freq: Four times a day (QID) | ORAL | Status: DC | PRN

## 2015-08-18 NOTE — Progress Notes (Signed)
   HPI: Patient is a 60 year old who presented to the office today concerned of a nodularity she felt on her left breast along with black and blue marked. Patient stated approximate 1-1/2 weeks ago she fell against some drawer and hit herself limited side of her left breast. She has some tightness when she takes a deep breath no restarted today. She denies any family history of breast cancer except and aunt. Her mammogram are up-to-date. She has not been on any hormone replacement therapy. She denies any nipple discharge. She denies any fever, chills, nausea, or vomiting.   ROS: A ROS was performed and pertinent positives and negatives are included in the history.  GENERAL: No fevers or chills. HEENT: No change in vision, no earache, sore throat or sinus congestion. NECK: No pain or stiffness. CARDIOVASCULAR: No chest pain or pressure. No palpitations. PULMONARY: Tenderness on the left sidewall when she takes deep breaths GASTROINTESTINAL: No abdominal pain, nausea, vomiting or diarrhea, melena or bright red blood per rectum. GENITOURINARY: No urinary frequency, urgency, hesitancy or dysuria. MUSCULOSKELETAL: No joint or muscle pain, no back pain, no recent trauma. DERMATOLOGIC: No rash, no itching, no lesions. ENDOCRINE: No polyuria, polydipsia, no heat or cold intolerance. No recent change in weight. HEMATOLOGICAL: No anemia or easy bruising or bleeding. NEUROLOGIC: No headache, seizures, numbness, tingling or weakness. PSYCHIATRIC: No depression, no loss of interest in normal activity or change in sleep pattern.   PE: Blood pressure 124/80   weight 140 pounds   5 feet 5 inches tall    BMI 23.30 kg/m Her breasts were examined sitting supine position  Physical Exam  Pulmonary/Chest:    Patient with no tenderness over the rib cage area.   Assessment Plan: Recent fall on left breast side 1-1/2 weeks ago ecchymosis was noted slightly above this area was a 2 cm mobile tender or nodular area. A chest  x-ray p.m. lateral will be done to assess her rib cage we will also going to schedule a diagnostic mammogram of her left breast with possible ultrasound to this nodule area that was palpated. For discomfort she was prescribed Ultram 50 mg one by mouth every 6 hours when necessary.    Greater than 50% of time was spent in counseling and coordinating care of this patient.   Time of consultation: 15   Minutes.

## 2015-08-18 NOTE — Patient Instructions (Signed)
Hematoma  A hematoma is a collection of blood under the skin, in an organ, in a body space, in a joint space, or in other tissue. The blood can clot to form a lump that you can see and feel. The lump is often firm and may sometimes become sore and tender. Most hematomas get better in a few days to weeks. However, some hematomas may be serious and require medical care. Hematomas can range in size from very small to very large.  CAUSES   A hematoma can be caused by a blunt or penetrating injury. It can also be caused by spontaneous leakage from a blood vessel under the skin. Spontaneous leakage from a blood vessel is more likely to occur in older people, especially those taking blood thinners. Sometimes, a hematoma can develop after certain medical procedures.  SIGNS AND SYMPTOMS   · A firm lump on the body.  · Possible pain and tenderness in the area.  · Bruising. Blue, dark blue, purple-red, or yellowish skin may appear at the site of the hematoma if the hematoma is close to the surface of the skin.  For hematomas in deeper tissues or body spaces, the signs and symptoms may be subtle. For example, an intra-abdominal hematoma may cause abdominal pain, weakness, fainting, and shortness of breath. An intracranial hematoma may cause a headache or symptoms such as weakness, trouble speaking, or a change in consciousness.  DIAGNOSIS   A hematoma can usually be diagnosed based on your medical history and a physical exam. Imaging tests may be needed if your health care provider suspects a hematoma in deeper tissues or body spaces, such as the abdomen, head, or chest. These tests may include ultrasonography or a CT scan.   TREATMENT   Hematomas usually go away on their own over time. Rarely does the blood need to be drained out of the body. Large hematomas or those that may affect vital organs will sometimes need surgical drainage or monitoring.  HOME CARE INSTRUCTIONS   · Apply ice to the injured area:      Put ice in a  plastic bag.      Place a towel between your skin and the bag.      Leave the ice on for 20 minutes, 2-3 times a day for the first 1 to 2 days.    · After the first 2 days, switch to using warm compresses on the hematoma.    · Elevate the injured area to help decrease pain and swelling. Wrapping the area with an elastic bandage may also be helpful. Compression helps to reduce swelling and promotes shrinking of the hematoma. Make sure the bandage is not wrapped too tight.    · If your hematoma is on a lower extremity and is painful, crutches may be helpful for a couple days.    · Only take over-the-counter or prescription medicines as directed by your health care provider.  SEEK IMMEDIATE MEDICAL CARE IF:   · You have increasing pain, or your pain is not controlled with medicine.    · You have a fever.    · You have worsening swelling or discoloration.    · Your skin over the hematoma breaks or starts bleeding.    · Your hematoma is in your chest or abdomen and you have weakness, shortness of breath, or a change in consciousness.  · Your hematoma is on your scalp (caused by a fall or injury) and you have a worsening headache or a change in alertness or consciousness.  MAKE SURE YOU:   ·   Understand these instructions.  · Will watch your condition.  · Will get help right away if you are not doing well or get worse.     This information is not intended to replace advice given to you by your health care provider. Make sure you discuss any questions you have with your health care provider.     Document Released: 09/27/2003 Document Revised: 10/15/2012 Document Reviewed: 07/23/2012  Elsevier Interactive Patient Education ©2016 Elsevier Inc.

## 2015-08-19 ENCOUNTER — Encounter (HOSPITAL_COMMUNITY): Payer: Self-pay | Admitting: *Deleted

## 2015-08-19 ENCOUNTER — Emergency Department (HOSPITAL_COMMUNITY): Payer: Federal, State, Local not specified - PPO

## 2015-08-19 ENCOUNTER — Emergency Department (HOSPITAL_COMMUNITY)
Admission: EM | Admit: 2015-08-19 | Discharge: 2015-08-19 | Disposition: A | Discharge status: Home / Self Care | Payer: Federal, State, Local not specified - PPO | Attending: Emergency Medicine | Admitting: Emergency Medicine

## 2015-08-19 ENCOUNTER — Other Ambulatory Visit: Payer: Self-pay

## 2015-08-19 ENCOUNTER — Telehealth: Payer: Self-pay | Admitting: *Deleted

## 2015-08-19 DIAGNOSIS — R079 Chest pain, unspecified: Secondary | ICD-10-CM | POA: Insufficient documentation

## 2015-08-19 DIAGNOSIS — Y929 Unspecified place or not applicable: Secondary | ICD-10-CM | POA: Diagnosis not present

## 2015-08-19 DIAGNOSIS — Y939 Activity, unspecified: Secondary | ICD-10-CM | POA: Diagnosis not present

## 2015-08-19 DIAGNOSIS — Y999 Unspecified external cause status: Secondary | ICD-10-CM | POA: Diagnosis not present

## 2015-08-19 DIAGNOSIS — W19XXXA Unspecified fall, initial encounter: Secondary | ICD-10-CM | POA: Diagnosis not present

## 2015-08-19 DIAGNOSIS — Z79899 Other long term (current) drug therapy: Secondary | ICD-10-CM | POA: Diagnosis not present

## 2015-08-19 DIAGNOSIS — Z791 Long term (current) use of non-steroidal anti-inflammatories (NSAID): Secondary | ICD-10-CM | POA: Insufficient documentation

## 2015-08-19 LAB — CBC
HCT: 37.3 % (ref 36.0–46.0)
Hemoglobin: 13 g/dL (ref 12.0–15.0)
MCH: 31.5 pg (ref 26.0–34.0)
MCHC: 34.9 g/dL (ref 30.0–36.0)
MCV: 90.3 fL (ref 78.0–100.0)
PLATELETS: 245 10*3/uL (ref 150–400)
RBC: 4.13 MIL/uL (ref 3.87–5.11)
RDW: 12.7 % (ref 11.5–15.5)
WBC: 5.5 10*3/uL (ref 4.0–10.5)

## 2015-08-19 LAB — BASIC METABOLIC PANEL
Anion gap: 5 (ref 5–15)
BUN: 6 mg/dL (ref 6–20)
CALCIUM: 9.6 mg/dL (ref 8.9–10.3)
CO2: 27 mmol/L (ref 22–32)
CREATININE: 0.54 mg/dL (ref 0.44–1.00)
Chloride: 107 mmol/L (ref 101–111)
GFR calc non Af Amer: >60 mL/min (ref >60)
Glucose, Bld: 97 mg/dL (ref 65–99)
Potassium: 3.5 mmol/L (ref 3.5–5.1)
SODIUM: 139 mmol/L (ref 135–145)

## 2015-08-19 LAB — I-STAT TROPONIN, ED: TROPONIN I, POC: 0 ng/mL (ref 0.00–0.08)

## 2015-08-19 MED ORDER — PREDNISONE 20 MG PO TABS
60.0000 mg | ORAL_TABLET | ORAL | Status: AC
  Administered 2015-08-19: 60 mg via ORAL
  Filled 2015-08-19: qty 3

## 2015-08-19 MED ORDER — PREDNISONE 20 MG PO TABS: 40.0000 mg | ORAL_TABLET | Freq: Every day | ORAL | Status: AC

## 2015-08-19 NOTE — Telephone Encounter (Signed)
-----   Message from Ok EdwardsJuan H Fernandez, MD sent at 08/18/2015  3:31 PM EDT ----- [facility]amm[facility] left breast. Had a fall 1 1/2 weeks ago. Has a 2 cm tender mass left breast 2 fingerbreadth's from areolar region.

## 2015-08-19 NOTE — ED Provider Notes (Signed)
CSN: [id number]     Arrival date & time 08/19/15  1447 History   First MD Initiated Contact with Patient 08/19/15 1700     Chief Complaint  Patient presents with  . Chest Pain     (Consider location/radiation/quality/duration/timing/severity/associated sxs/prior Treatment) HPI Patient presents with concern of left-sided chest pain. She notes that she has had chest pain for some time, typically associated with sleeping on her left side.  She woke and had the pain seemed more persistent. It has improved spontaneously over the day, with only in intervention ibuprofen. No current dyspnea, lightheadedness, syncope, nausea, vomiting. No fever, chills. Patient is notable history of fall about one week ago, during which she fell onto adoor knob on the left breast area. This is a focal area of pain. She denies medical problems, does not smoke, does not drink. She saw her gynecologist yesterday,conveyed her concern of chest pain, had x-ray performed. Per report, this was normal.  Past Medical History  Diagnosis Date  . Allergy   . Arthritis   . Osteopenia 04/2015    T score -1.8 FRAX 14%/1.8%   Past Surgical History  Procedure Laterality Date  . Abdominal hysterectomy     Family History  Problem Relation Age of Onset  . Heart disease Father   . Diabetes Father    Social History  Substance Use Topics  . Smoking status: Never Smoker   . Smokeless tobacco: None  . Alcohol Use: No   OB History    Gravida Para Term Preterm AB TAB SAB Ectopic Multiple Living            0     Review of Systems  Constitutional:       Per HPI, otherwise negative  HENT:       Per HPI, otherwise negative  Respiratory:       Per HPI, otherwise negative  Cardiovascular:       Per HPI, otherwise negative  Gastrointestinal: Negative for vomiting.  Endocrine:       Negative aside from HPI  Genitourinary:       Neg aside from HPI   Musculoskeletal:       Per HPI, otherwise negative  Skin:  Negative.   Neurological: Negative for syncope.      Allergies  Other  Home Medications   Prior to Admission medications   Medication Sig Start Date End Date Taking? Authorizing Provider  calcium carbonate (OS-CAL - DOSED IN MG OF ELEMENTAL CALCIUM) 1250 (500 Ca) MG tablet Take 1 tablet by mouth daily.   Yes Historical Provider, MD  ibuprofen (ADVIL,MOTRIN) 200 MG tablet Take 200-400 mg by mouth every 6 (six) hours as needed for pain.   Yes Historical Provider, MD  Multiple Vitamin (MULTIVITAMIN WITH MINERALS) TABS Take 1 tablet by mouth daily.   Yes Historical Provider, MD  Omega-3 1000 MG CAPS Take 1 tablet by mouth daily.   Yes Historical Provider, MD  Vitamin D, Ergocalciferol, (DRISDOL) 50000 units CAPS capsule Take 1 capsule (50,000 Units total) by mouth every 7 (seven) days. Take 1 every 7 days 05/03/15  Yes Harrington ChallengerNancy J Young, NP  diphenhydrAMINE (BENADRYL) 25 MG tablet Take 2 tablets (50 mg total) by mouth every 4 (four) hours as needed for itching. Patient not taking: Reported on 08/19/2015 07/13/12   April Palumbo, MD  traMADol (ULTRAM) 50 MG tablet Take 1 tablet (50 mg total) by mouth every 6 (six) hours as needed. 08/18/15   Ok EdwardsJuan H Fernandez, MD  BP 132/81 mmHg  Pulse 81  Temp(Src) 99.5 F (37.5 C) (Oral)  Resp 18  Ht 5\' 4"  (1.626 m)  Wt 140 lb (63.504 kg)  BMI 24.02 kg/m2  SpO2 99% Physical Exam  Constitutional: She is oriented to person, place, and time. She appears well-developed and well-nourished. No distress.  HENT:  Head: Normocephalic and atraumatic.  Eyes: Conjunctivae and EOM are normal.  Cardiovascular: Normal rate, regular rhythm and intact distal pulses.   Pulmonary/Chest: Effort normal. No stridor. No respiratory distress.  Patient indicates that the pain is most substantial in the L breast area and laterally.  Abdominal: She exhibits no distension.  Musculoskeletal: She exhibits no edema.  Neurological: She is alert and oriented to person, place, and time.  No cranial nerve deficit.  Skin: Skin is warm and dry.  Psychiatric: She has a normal mood and affect.  Nursing note and vitals reviewed.   ED Course  Procedures (including critical care time) Labs Review Labs Reviewed  BASIC METABOLIC PANEL  CBC  I-STAT TROPOININ, ED    Imaging Review Dg Chest 2 View  08/18/2015  CLINICAL DATA:  Fall with left chest wall hematoma and pleuritic chest pain. EXAM: CHEST  2 VIEW COMPARISON:  None. FINDINGS: Normal heart size. Normal mediastinal contour. No pneumothorax. No pleural effusion. Lungs appear clear, with no acute consolidative airspace disease and no pulmonary edema. The area of clinical concern as indicated by the patient in the anterolateral left chest wall was indicated with a metallic skin BB by the technologist. There is soft tissue swelling at the area of clinical concern. No displaced fractures. IMPRESSION: No active cardiopulmonary disease. No pneumothorax. No pleural effusion. No displaced fractures. Consider dedicated left rib radiographs if the patient's symptoms persist. Electronically Signed   By: Delbert PhenixJason A Poff M.D.   On: 08/18/2015 16:28   Dg Ribs Unilateral Left  08/19/2015  CLINICAL DATA:  Pt fell X1 week ago and hit left upper chest on knob; onset of worsening pain with bruising X1 day ago to mid anterior/lateral rib area; pain worsens when taking a deep breath EXAM: LEFT RIBS - 2 VIEW COMPARISON:  08/18/2015 FINDINGS: No fracture or other bone lesions are seen involving the ribs. IMPRESSION: Negative. Electronically Signed   By: Corlis Leak  Hassell M.D.   On: 08/19/2015 19:35   I have personally reviewed and evaluated these images and lab results as part of my medical decision-making.   EKG Interpretation   Date/Time:  Friday August 19 2015 14:54:17 EDT Ventricular Rate:  95 PR Interval:  160 QRS Duration: 94 QT Interval:  356 QTC Calculation: 447 R Axis:   40 Text Interpretation:  Normal sinus rhythm Incomplete right bundle branch   block Abnormal ekg Confirmed by Gerhard MunchLOCKWOOD, Paarth Cropper  MD [phone]([id number]) on 08/19/2015  6:03:33 PM      on repeat exam the patient is in no distress. She is aware of all findings. With reassuring results, the patient will be discharged with course of initiation of anti-inflammatories, will follow-up with primary care. MDM   Final diagnoses:  Fall    Patient presents with left-sided chest pain. Evaluation here is largely reassuring, and there is no evidence for ongoing coronary ischemia. In addition, the patient has recent trauma suggested possible musculoskeletal etiology. With no evidence for hemodynamic instability, nopromise, no evidence for PE, ACS, infection, the patient was discharged in stable condition with a new course of analgesia.  Gerhard Munchobert Julyssa Kyer, MD 08/19/15 1958

## 2015-08-19 NOTE — Discharge Instructions (Signed)
As discussed, your evaluation today has been largely reassuring.  But, it is important that you monitor your condition carefully, and do not hesitate to return to the ED if you develop new, or concerning changes in your condition.  Otherwise, please follow-up with your physician for appropriate ongoing care.   Chest Wall Pain Chest wall pain is pain in or around the bones and muscles of your chest. Sometimes, an injury causes this pain. Sometimes, the cause may not be known. This pain may take several weeks or longer to get better. HOME CARE Pay attention to any changes in your symptoms. Take these actions to help with your pain:  Rest as told by your doctor.  Avoid activities that cause pain. Try not to use your chest, belly (abdominal), or side muscles to lift heavy things.  If directed, apply ice to the painful area:  Put ice in a plastic bag.  Place a towel between your skin and the bag.  Leave the ice on for 20 minutes, 2-3 times per day.  Take over-the-counter and prescription medicines only as told by your doctor.  Do not use tobacco products, including cigarettes, chewing tobacco, and e-cigarettes. If you need help quitting, ask your doctor.  Keep all follow-up visits as told by your doctor. This is important. GET HELP IF:  You have a fever.  Your chest pain gets worse.  You have new symptoms. GET HELP RIGHT AWAY IF:  You feel sick to your stomach (nauseous) or you throw up (vomit).  You feel sweaty or light-headed.  You have a cough with phlegm (sputum) or you cough up blood.  You are short of breath.   This information is not intended to replace advice given to you by your health care provider. Make sure you discuss any questions you have with your health care provider.   Document Released: 08/01/2007 Document Revised: 11/03/2014 Document Reviewed: 05/10/2014 Elsevier Interactive Patient Education [employer]2016 Elsevier Inc.

## 2015-08-19 NOTE — ED Notes (Addendum)
Pt reports left side chest pain since yesterday. Had a recent fall that caused left breast pain and bruising. This chest pain seems to increase when breathing and certain movements. [facility] and had chest xray done.  ekg done and no acute distress noted at triage.

## 2015-08-19 NOTE — Telephone Encounter (Signed)
Appointment on 09/08/15 @ 8:45am order faxed. Pt informed with time and date. I also told pt that her chest X-ray was normal from yesterday. Pt said she woke up this am with lots of chest so bad that she almost called 911. I told pt that she should have, no pain now, states it was sharp pain. I once again told her to call 911 if the pain should occur again. Pt verbalized she understood.

## 2015-08-29 ENCOUNTER — Encounter: Payer: Self-pay | Admitting: Anesthesiology

## 2015-10-11 ENCOUNTER — Encounter: Payer: Self-pay | Admitting: Gynecology

## 2015-11-09 ENCOUNTER — Other Ambulatory Visit: Payer: Self-pay | Admitting: Women's Health

## 2015-11-09 DIAGNOSIS — E559 Vitamin D deficiency, unspecified: Secondary | ICD-10-CM

## 2016-03-06 ENCOUNTER — Ambulatory Visit: Payer: Federal, State, Local not specified - PPO | Admitting: Physical Therapy

## 2016-04-11 ENCOUNTER — Ambulatory Visit (INDEPENDENT_AMBULATORY_CARE_PROVIDER_SITE_OTHER): Payer: Federal, State, Local not specified - PPO | Admitting: Women's Health

## 2016-04-11 ENCOUNTER — Encounter: Payer: Self-pay | Admitting: Women's Health

## 2016-04-11 VITALS — BP 124/70 | Ht 65.0 in | Wt 137.8 lb

## 2016-04-11 DIAGNOSIS — Z01419 Encounter for gynecological examination (general) (routine) without abnormal findings: Secondary | ICD-10-CM | POA: Diagnosis not present

## 2016-04-11 DIAGNOSIS — Z13220 Encounter for screening for lipoid disorders: Secondary | ICD-10-CM

## 2016-04-11 DIAGNOSIS — E559 Vitamin D deficiency, unspecified: Secondary | ICD-10-CM

## 2016-04-11 DIAGNOSIS — Z1151 Encounter for screening for human papillomavirus (HPV): Secondary | ICD-10-CM | POA: Diagnosis not present

## 2016-04-11 LAB — COMPREHENSIVE METABOLIC PANEL
ALT: 23 U/L (ref 6–29)
AST: 22 U/L (ref 10–35)
Albumin: 4.2 g/dL (ref 3.6–5.1)
Alkaline Phosphatase: 80 U/L (ref 33–130)
BUN: 10 mg/dL (ref 7–25)
CALCIUM: 9.7 mg/dL (ref 8.6–10.4)
CO2: 25 mmol/L (ref 20–31)
Chloride: 106 mmol/L (ref 98–110)
Creat: 0.59 mg/dL (ref 0.50–1.05)
GLUCOSE: 87 mg/dL (ref 65–99)
POTASSIUM: 3.7 mmol/L (ref 3.5–5.3)
Sodium: 141 mmol/L (ref 135–146)
Total Bilirubin: 0.9 mg/dL (ref 0.2–1.2)
Total Protein: 6.7 g/dL (ref 6.1–8.1)

## 2016-04-11 LAB — CBC WITH DIFFERENTIAL/PLATELET
Basophils Absolute: 41 cells/uL (ref 0–200)
Basophils Relative: 1 %
Eosinophils Absolute: 82 cells/uL (ref 15–500)
Eosinophils Relative: 2 %
HEMATOCRIT: 40 % (ref 35.0–45.0)
Hemoglobin: 13.5 g/dL (ref 11.7–15.5)
LYMPHS PCT: 43 %
Lymphs Abs: 1763 cells/uL (ref 850–3900)
MCH: 31.2 pg (ref 27.0–33.0)
MCHC: 33.8 g/dL (ref 32.0–36.0)
MCV: 92.4 fL (ref 80.0–100.0)
MONO ABS: 369 {cells}/uL (ref 200–950)
MONOS PCT: 9 %
MPV: 8.7 fL (ref 7.5–12.5)
NEUTROS PCT: 45 %
Neutro Abs: 1845 cells/uL (ref 1500–7800)
Platelets: 257 10*3/uL (ref 140–400)
RBC: 4.33 MIL/uL (ref 3.80–5.10)
RDW: 13.1 % (ref 11.0–15.0)
WBC: 4.1 10*3/uL (ref 3.8–10.8)

## 2016-04-11 LAB — LIPID PANEL
CHOL/HDL RATIO: 3.7 ratio (ref <5.0)
Cholesterol: 198 mg/dL (ref <200)
HDL: 54 mg/dL (ref >50)
LDL CALC: 117 mg/dL — AB (ref <100)
Triglycerides: 133 mg/dL (ref <150)
VLDL: 27 mg/dL (ref <30)

## 2016-04-11 NOTE — Patient Instructions (Signed)

## 2016-04-11 NOTE — Progress Notes (Signed)
Megan Lam 10/14/1955 [id number]    History:    Presents for annual exam.  2010 TAH supracervical hysterectomy for fibroids without LOA for benign cyst. Virgin. 07/2015 breast hematoma from fall. Normal Pap and mammogram history. 2014 negative for trocar colonoscopy. 04/2015 T score -1.1 spine, -1.5 at forearm FRAX 14%/1.8%.   Past medical history, past surgical history, family history and social history were all reviewed and documented in the EPIC chart. Works at the [employer]. Father hypertension and diabetes. Helps care for her aging parents.  ROS:  A ROS was performed and pertinent positives and negatives are included.  Exam:  Vitals:   04/11/16 0935  Weight: 137 lb 12.8 oz (62.5 kg)  Height: 5\' 5"  (1.651 m)   Body mass index is 22.93 kg/m.   General appearance:  Normal Thyroid:  Symmetrical, normal in size, without palpable masses or nodularity. Respiratory  Auscultation:  Clear without wheezing or rhonchi Cardiovascular  Auscultation:  Regular rate, without rubs, murmurs or gallops  Edema/varicosities:  Not grossly evident Abdominal  Soft,nontender, without masses, guarding or rebound.  Liver/spleen:  No organomegaly noted  Hernia:  None appreciated  Skin  Inspection:  Grossly normal   Breasts: Examined lying and sitting.     Right: Without masses, retractions, discharge or axillary adenopathy.     Left: Without masses, retractions, discharge or axillary adenopathy. Gentitourinary   Inguinal/mons:  Normal without inguinal adenopathy  External genitalia:  Normal  BUS/Urethra/Skene's glands:  Normal  Vagina:  Normal  Cervix:  Normal  Uterus:  Absent Adnexa/parametria:     Rt: Without masses or tenderness.   Lt: Without masses or tenderness.  Anus and perineum: Normal  Digital rectal exam: Normal sphincter tone without palpated masses or tenderness  Assessment/Plan:  61 y.o. S WF virgin for annual exam no complaints.  2010 TAH supracervical and LOA on no  HRT Osteopenia without elevated FRAX  Plan: SBE's, continue annual 3-D screening mammogram history of dense breasts. Exercise, calcium rich diet, vitamin D 2000 daily encouraged. CBC, CMP, lipid panel, vitamin D, UA, Pap with HR HPV typing, new screening guidelines reviewed. Zostavax at age 60 encouraged. Home safety, fall prevention and importance of weightbearing exercise reviewed.    Harrington ChallengerYOUNG,NANCY J [facility] LLCWHNP, 9:40 AM 04/11/2016

## 2016-04-12 LAB — VITAMIN D 25 HYDROXY (VIT D DEFICIENCY, FRACTURES): VIT D 25 HYDROXY: 44 ng/mL (ref 30–100)

## 2016-04-13 LAB — PAP, TP IMAGING W/ HPV RNA, RFLX HPV TYPE 16,18/45: HPV mRNA, High Risk: NOT DETECTED

## 2016-07-11 ENCOUNTER — Encounter: Payer: Self-pay | Admitting: Gynecology

## 2016-09-06 ENCOUNTER — Ambulatory Visit: Payer: Federal, State, Local not specified - PPO | Admitting: Physical Therapy

## 2016-09-11 ENCOUNTER — Encounter: Payer: Self-pay | Admitting: Physical Therapy

## 2016-09-11 ENCOUNTER — Ambulatory Visit: Payer: Federal, State, Local not specified - PPO | Attending: Family Medicine | Admitting: Physical Therapy

## 2016-09-11 DIAGNOSIS — R29898 Other symptoms and signs involving the musculoskeletal system: Secondary | ICD-10-CM | POA: Diagnosis present

## 2016-09-11 DIAGNOSIS — M25552 Pain in left hip: Secondary | ICD-10-CM | POA: Diagnosis present

## 2016-09-11 DIAGNOSIS — M6281 Muscle weakness (generalized): Secondary | ICD-10-CM | POA: Insufficient documentation

## 2016-09-11 NOTE — Therapy (Signed)
[facility] [address] [place], [place], [address] Phone: [phone]   Fax:  [phone]  Physical Therapy Evaluation  Patient Details  Name: Megan Lam MRN: [id number] Date of Birth: 09/01/1955 Referring Provider: Virl Son  Encounter Date: 09/11/2016      PT End of Session - 09/11/16 1039    Visit Number 1   Date for PT Re-Evaluation 11/06/16   PT Start Time 0925   PT Stop Time 1010   PT Time Calculation (min) 45 min   Activity Tolerance Patient tolerated treatment well   Behavior During Therapy [facility] for tasks assessed/performed      Past Medical History:  Diagnosis Date  . Allergy   . Arthritis   . Osteopenia 04/2015   T score -1.8 FRAX 14%/1.8%    Past Surgical History:  Procedure Laterality Date  . ABDOMINAL HYSTERECTOMY      There were no vitals filed for this visit.       Subjective Assessment - 09/11/16 0928    Subjective Pt reports in April 2018 she begain having tenderness and low grade pain over the area of her ASIS and iliac crest. She reports no specific event that started her pain. Area is tender to palpation. Xray is negative. Reports tenderness with walking and standing.    Limitations Walking;Standing   How long can you stand comfortably? intermittent   How long can you walk comfortably? intermittent   Patient Stated Goals Reduce pain.    Currently in Pain? Yes   Pain Score 0-No pain   Pain Location Hip   Pain Orientation Left   Pain Descriptors / Indicators Tender;Sore   Pain Radiating Towards none - does report restless leg syndrome.    Pain Onset More than a month ago   Pain Frequency Several days a week   Aggravating Factors  Walking, laying on that side - 3/10   Pain Relieving Factors Avoid laying on L side   Effect of Pain on Daily Activities Still doing everything she wants too.             [facility] PT Assessment - 09/11/16 0001      Assessment   Medical Diagnosis  L hip pain   Referring Provider Tammy Leavy Cella   Onset Date/Surgical Date --  April 2018   Next MD Visit none   Prior Therapy Not for this. Chiro for back.     Balance Screen   Has the patient fallen in the past 6 months No   Has the patient had a decrease in activity level because of a fear of falling?  No   Is the patient reluctant to leave their home because of a fear of falling?  No     Home Environment   Living Environment Private residence   Living Arrangements --  Family   Additional Comments 14 stairs in the house to upstairs. Rails on both sides.      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    Leisure Social research officer, government, painting, housework, cleaning. No regular workout routine.      Sensation   Additional Comments Pt reports no issues with sensation.      Posture/Postural Control   Posture Comments Forward head, rounded shoulders.      ROM / Strength   AROM / PROM / Strength AROM;Strength  AROM   Overall AROM Comments Lumber AROM: ext. limited by 25%, some "tightness" with left side bending but ROM appeared normal in all other directions.      Strength   Overall Strength Comments Hip flexion L and R 4-/5     Flexibility   Soft Tissue Assessment /Muscle Length yes   Hamstrings Good ROM but some tightness with stretching.   ITB No tightness   Piriformis Good ROM but some tightness with stretching.      Palpation   Palpation comment Pt has tenderness over her anterior iliac crest with palpation. Her lumbar vertabra have normal mobility. She has low muscle tone in hip extensors, and abductors.      Special Tests    Special Tests Sacrolliac Tests   Sacroiliac Tests  Gaenslen's Test  Thigh thrust SIJ test was also negative.      Pelvic Dictraction   Findings Negative   Comment Pt had her normal pain over her ASIS, but not SIJ pain.     Pelvic  Compression   Findings Negative   Side Left   comment Pt had her normal pain over ASIS, but not SIJ pain.      Gaenslen's test   Findings Negative     Sacral Compression   Findings Negative     Ambulation/Gait   Gait Comments No antalgic gait observed during this session.             Objective measurements completed on examination: See above findings.                  PT Education - 09/11/16 1039    Education provided Yes   Education Details Hip extension and abduction exercises with good abdominal support.    Person(s) Educated Patient   Methods Explanation;Demonstration;Verbal cues;Handout   Comprehension Verbalized understanding          PT Short Term Goals - 09/11/16 1048      PT SHORT TERM GOAL #1   Title Independent with initial HEP.    Time 1   Period Weeks   Status New           PT Long Term Goals - 09/11/16 1049      PT LONG TERM GOAL #1   Title Increase hip strength to 4+/5 in all directions.    Time 8   Period Weeks   Status New     PT LONG TERM GOAL #2   Title Independent with advanced HEP, gym routine.    Time 8   Period Weeks   Status New     PT LONG TERM GOAL #3   Title Pt will be able stand/sit/walking at work without pain or difficutly.    Time 8   Period Weeks   Status New     PT LONG TERM GOAL #4   Title Pt will be able to work in her garden without pain or difficulty.    Time 8   Period Weeks   Status New                Plan - 09/11/16 1041    Clinical Impression Statement Pt has a "fibrous" tissue over the area of her iliac crest that is painful when pressed against the bone. This area also bothers her intermittently and when she walks and stands for a period of time and when she lays on her left side.    Clinical Presentation Evolving   Clinical Decision Making Low  Rehab Potential Good   PT Frequency 2x / week   PT Duration 8 weeks   PT Treatment/Interventions ADLs/Self Care Home  Management;Electrical Stimulation;Cryotherapy;Iontophoresis 4mg /ml Dexamethasone;Moist Heat;Ultrasound;Neuromuscular re-education;Therapeutic exercise;Therapeutic activities;Patient/family education;Manual techniques;Passive range of motion   PT Next Visit Plan Work with Pt on strenthening hip, back, and core muscles, especially hip abduction, ext, and ER.    PT Home Exercise Plan Gave Pt HEP of clam shells, side lying hip abd., prone hip ext (bent), prone glute lifts, hip abd (side-lying), fire hydrant, quadruped glute ext.    Consulted and Agree with Plan of Care Patient      Patient will benefit from skilled therapeutic intervention in order to improve the following deficits and impairments:  Decreased activity tolerance, Decreased endurance, Decreased strength, Difficulty walking, Postural dysfunction, Pain  Visit Diagnosis: Pain in left hip  Muscle weakness (generalized)  Other symptoms and signs involving the musculoskeletal system     Problem List Patient Active Problem List   Diagnosis Date Noted  . History of hysterectomy, supracervical 03/31/2014    Megan Lam, SPT 09/11/2016, 12:22 PM  [facility] [address] [place], [place], [address] Phone: [phone]   Fax:  [phone]  Name: Shelby Dubinora Strom MRN: [id number] Date of Birth: 03/27/1955

## 2016-09-18 ENCOUNTER — Ambulatory Visit: Payer: Federal, State, Local not specified - PPO | Admitting: Physical Therapy

## 2016-09-19 ENCOUNTER — Ambulatory Visit: Payer: Federal, State, Local not specified - PPO | Admitting: Physical Therapy

## 2016-09-19 ENCOUNTER — Encounter: Payer: Self-pay | Admitting: Physical Therapy

## 2016-09-19 DIAGNOSIS — M6281 Muscle weakness (generalized): Secondary | ICD-10-CM

## 2016-09-19 DIAGNOSIS — M25552 Pain in left hip: Secondary | ICD-10-CM | POA: Diagnosis not present

## 2016-09-19 DIAGNOSIS — R29898 Other symptoms and signs involving the musculoskeletal system: Secondary | ICD-10-CM

## 2016-09-19 NOTE — Therapy (Addendum)
[facility] [place], [place], [address] Phone: [phone]   Fax:  [phone]  Physical Therapy Treatment  Patient Details  Name: Megan Lam MRN: [id number] Date of Birth: 28-Jun-1955 Referring Provider: Precious Haws  Encounter Date: 09/19/2016      PT End of Session - 09/19/16 1035    Visit Number 2   Date for PT Re-Evaluation 11/06/16   PT Start Time 0924   PT Stop Time 1010   PT Time Calculation (min) 46 min   Activity Tolerance Patient tolerated treatment well   Behavior During Therapy [facility] for tasks assessed/performed      Past Medical History:  Diagnosis Date  . Allergy   . Arthritis   . Osteopenia 04/2015   T score -1.8 FRAX 14%/1.8%    Past Surgical History:  Procedure Laterality Date  . ABDOMINAL HYSTERECTOMY      There were no vitals filed for this visit.      Subjective Assessment - 09/19/16 0934    Subjective Pt reports that she has been feeling okay since we last saw her. Reports that she has had some hip pain, thinks it is due to her matress.    Patient Stated Goals Reduce pain.    Currently in Pain? No/denies   Pain Score 0-No pain                         [facility] Adult PT Treatment/Exercise - 09/19/16 0001      Exercises   Exercises Lumbar;Knee/Hip     Lumbar Exercises: Aerobic   Stationary Bike --     Lumbar Exercises: Standing   Other Standing Lumbar Exercises --   Other Standing Lumbar Exercises Exercise cords 20# each side with strap across shoulder blades 10 reps     Lumbar Exercises: Supine   Bridge 15 reps  2 sets, feet on exercise ball     Knee/Hip Exercises: Aerobic   Nustep Level 5, 7 min  Level 6 caused knee pain     Knee/Hip Exercises: Standing   Knee Flexion Both;2 sets;10 reps  with pulleys   Knee Flexion Limitations 5#   Hip Flexion Both;2 sets;10 reps;Knee bent  with pulleys, lefts stronger than right   Hip Flexion Limitations  5#   Hip Abduction Both;2 sets;10 reps;Knee straight  ankle weights   Abduction Limitations 3#     Knee/Hip Exercises: Sidelying   Clams 2x10 red theraband     Knee/Hip Exercises: Prone   Other Prone Exercises HEP: including clamshells, sidelying hip abd, prone hip ext. (bent), prone glute lifts, quadruped hip ext (bent), fire hydrant, hip abd. sidelying 1X10 both sides                   PT Short Term Goals - 09/19/16 1038      PT SHORT TERM GOAL #1   Title Independent with initial HEP.    Time 1   Period Weeks   Status On-going           PT Long Term Goals - 09/11/16 1049      PT LONG TERM GOAL #1   Title Increase hip strength to 4+/5 in all directions.    Time 8   Period Weeks   Status New     PT LONG TERM GOAL #2   Title Independent with advanced HEP, gym routine.    Time 8   Period Weeks  Status New     PT LONG TERM GOAL #3   Title Pt will be able stand/sit/walking at work without pain or difficutly.    Time 8   Period Weeks   Status New     PT LONG TERM GOAL #4   Title Pt will be able to work in her garden without pain or difficulty.    Time 8   Period Weeks   Status New               Plan - 09/19/16 1036    Clinical Impression Statement Pt reported that she had not tried any of the HEP exercises that we had given her, so went through those and reiterated the importance of doing them at home. She did well with her exercises. Needed some cueing to keep good posture and form, she did not like the ankle weights that we used for hip Abd. Showed her theraband option.    PT Treatment/Interventions ADLs/Self Care Home Management;Electrical Stimulation;Cryotherapy;Iontophoresis 5m/ml Dexamethasone;Moist Heat;Ultrasound;Neuromuscular re-education;Therapeutic exercise;Therapeutic activities;Patient/family education;Manual techniques;Passive range of motion   PT Next Visit Plan Work with Pt on strenthening hip, back, and core muscles, especially  hip abduction, ext, and ER.    PT Home Exercise Plan HEP of clam shells, side lying hip abd., prone hip ext (bent), prone glute lifts, hip abd (side-lying), fire hydrant, quadruped glute ext.    Consulted and Agree with Plan of Care Patient      Patient will benefit from skilled therapeutic intervention in order to improve the following deficits and impairments:  Decreased activity tolerance, Decreased endurance, Decreased strength, Difficulty walking, Postural dysfunction, Pain  Visit Diagnosis: Pain in left hip  Muscle weakness (generalized)  Other symptoms and signs involving the musculoskeletal system     Problem List Patient Active Problem List   Diagnosis Date Noted  . History of hysterectomy, supracervical 03/31/2014   PHYSICAL THERAPY DISCHARGE SUMMARY   Plan: Patient agrees to discharge.  Patient goals were not met. Patient is being discharged due to not returning since the last visit.  ?????      KLennart Pall SPT 09/19/2016, 10:43 AM  [place] [address]Phone: [phone]  Fax:  [phone] Name: Megan MarsicanoMRN: [id number] of Birth: 317-Jun-1957

## 2016-09-20 ENCOUNTER — Ambulatory Visit: Payer: Federal, State, Local not specified - PPO | Admitting: Physical Therapy

## 2017-04-16 ENCOUNTER — Encounter: Payer: Federal, State, Local not specified - PPO | Admitting: Women's Health

## 2017-05-29 ENCOUNTER — Encounter: Payer: Federal, State, Local not specified - PPO | Admitting: Women's Health

## 2018-11-25 ENCOUNTER — Encounter: Payer: Self-pay | Admitting: Gynecology

## 2019-04-21 ENCOUNTER — Encounter: Payer: Self-pay | Admitting: Women's Health

## 2019-04-30 ENCOUNTER — Encounter: Payer: Self-pay | Admitting: Family Medicine

## 2019-04-30 ENCOUNTER — Ambulatory Visit (INDEPENDENT_AMBULATORY_CARE_PROVIDER_SITE_OTHER): Payer: Federal, State, Local not specified - PPO | Admitting: Family Medicine

## 2019-04-30 VITALS — Ht 64.5 in | Wt 135.0 lb

## 2019-04-30 DIAGNOSIS — M79652 Pain in left thigh: Secondary | ICD-10-CM

## 2019-04-30 MED ORDER — IBUPROFEN 600 MG PO TABS: ORAL_TABLET | ORAL | 0 refills | Status: AC

## 2019-04-30 MED ORDER — DICLOFENAC SODIUM 1 % EX GEL: 2.0000 g | Freq: Four times a day (QID) | CUTANEOUS | 1 refills | Status: AC

## 2019-04-30 NOTE — Progress Notes (Signed)
Virtual Visit via Video Note  I connected with Megan Lam on 04/30/19 at 10:00 AM EST by a video enabled telemedicine application and verified that I am speaking with the correct person using two identifiers. Location patient: home Location provider:  home office Persons participating in the virtual visit: patient, provider  I discussed the limitations of evaluation and management by telemedicine and the availability of in person appointments. The patient expressed understanding and agreed to proceed.  Chief Complaint  Patient presents with  . Leg Pain    Pt had MRI on lt leg, feels bruised and when she stands it starts to cramp.  Hurts to touch and now rt leg is doing the same.  Pt said that lumps are in both legs.  At times she said that it feels like an indain burn     HPI: Megan Lam is a 64 y.o. female new to our office and complains of B/L leg pain, started with Lt and now Rt. Symptoms are chronic in nature. I can see notes from previous PCP documenting this issue back to early 2018. She describes pain as feeling "like a bruise" and when she stands she gets "cramps" in her legs. Legs can also be sore to touch and at times she likens to pain to an "[place] burn".  She notes stiffness in her Lt leg (thigh muscle) in AM or if she has been seated for a while. She states this is not in her hip or knee. She feels like it is a cramp in her leg. This occurs "almost daily" basis.   She has been recommended voltaren gel and well as trial of gabapentin but she did not want to do this.  Pt had Lt hip xray and MRI in late 2018 - normal MRI Lt femur in 04/2017 - normal  Past Medical History:  Diagnosis Date  . Allergy   . Arthritis   . Osteopenia 04/2015   T score -1.8 FRAX 14%/1.8%    Past Surgical History:  Procedure Laterality Date  . ABDOMINAL HYSTERECTOMY      Family History  Problem Relation Age of Onset  . Heart disease Father   . Diabetes Father     Social History    Tobacco Use  . Smoking status: Never Smoker  . Smokeless tobacco: Never Used  Substance Use Topics  . Alcohol use: No    Alcohol/week: 0.0 standard drinks  . Drug use: No     Current Outpatient Medications:  .  calcium carbonate (OS-CAL - DOSED IN MG OF ELEMENTAL CALCIUM) 1250 (500 Ca) MG tablet, Take 1 tablet by mouth daily., Disp: , Rfl:  .  diphenhydrAMINE (BENADRYL) 25 MG tablet, Take 2 tablets (50 mg total) by mouth every 4 (four) hours as needed for itching., Disp: 20 tablet, Rfl: 0 .  ibuprofen (ADVIL,MOTRIN) 200 MG tablet, Take 200-400 mg by mouth every 6 (six) hours as needed for pain., Disp: , Rfl:  .  Multiple Vitamin (MULTIVITAMIN WITH MINERALS) TABS, Take 1 tablet by mouth daily., Disp: , Rfl:  .  Omega-3 1000 MG CAPS, Take 1 tablet by mouth daily., Disp: , Rfl:  .  Vitamin D, Ergocalciferol, (DRISDOL) 50000 units CAPS capsule, Take 1 capsule (50,000 Units total) by mouth every 7 (seven) days. Take 1 every 7 days, Disp: 12 capsule, Rfl: 1 .  predniSONE (DELTASONE) 20 MG tablet, Take 2 tablets (40 mg total) by mouth daily with breakfast. For the next four days (Patient not taking: Reported on  04/11/2016), Disp: 8 tablet, Rfl: 0 .  traMADol (ULTRAM) 50 MG tablet, Take 1 tablet (50 mg total) by mouth every 6 (six) hours as needed. (Patient not taking: Reported on 04/30/2019), Disp: 30 tablet, Rfl: 0 .  Zinc 100 MG TABS, Take by mouth., Disp: , Rfl:   Allergies  Allergen Reactions  . Other Itching and Other (See Comments)    Environmental and seasonal allergies: Runny eyes and nose (also)      ROS: See pertinent positives and negatives per HPI.   EXAM:  VITALS per patient if applicable: Ht 5' 4.5" (1.638 m)   Wt 135 lb (61.2 kg)   BMI 22.81 kg/m    GENERAL: alert, oriented, appears well and in no acute distress  HEENT: atraumatic, conjunctiva clear, no obvious abnormalities on inspection of external nose and ears  NECK: normal movements of the head and  neck  LUNGS: on inspection no signs of respiratory distress, breathing rate appears normal, no obvious gross SOB, gasping or wheezing, no conversational dyspnea  CV: no obvious cyanosis  PSYCH/NEURO: pleasant and cooperative, no obvious depression or anxiety, speech and thought processing grossly intact   ASSESSMENT AND PLAN: 1. Left thigh pain - chronic in nature - MRI in 2019 - normal Rx: - ibuprofen (ADVIL) 600 MG tablet; 1 tab BID with food x 7-10 days  Dispense: 30 tablet; Refill: 0 - diclofenac Sodium (VOLTAREN) 1 % GEL; Apply 2 g topically 4 (four) times daily.  Dispense: 100 g; Refill: 1 - heating pad BID-TID - consider PT referral but pt declines at this time - f/u if symptoms worsen or do not improve in 2-3 wks Discussed plan and reviewed medications with patient, including risks, benefits, and potential side effects. Pt expressed understand. All questions answered.    I discussed the assessment and treatment plan with the patient. The patient was provided an opportunity to ask questions and all were answered. The patient agreed with the plan and demonstrated an understanding of the instructions.   The patient was advised to call back or seek an in-person evaluation if the symptoms worsen or if the condition fails to improve as anticipated.   Megan Median, DO

## 2019-11-03 ENCOUNTER — Telehealth: Payer: Self-pay

## 2019-11-03 NOTE — Telephone Encounter (Signed)
Patient called in voice mail. Had a hysterectomy in 2010 with Dr. Reece Agar.  One ovary was removed and she wants to know which one.  The op note is not in Epic but there is a pathology report from 03/18/2008 and it indicates that the left tube and ovary were removed.  I called patient back and she said I could leave a message in her voice mail however, her mailbox is full and I cannot.

## 2019-11-04 NOTE — Telephone Encounter (Signed)
Patient called back and I told her the pathology report indicated left tube and ovary and uterus removed.

## 2020-03-04 ENCOUNTER — Other Ambulatory Visit: Payer: Self-pay | Admitting: Surgery

## 2020-03-04 DIAGNOSIS — M7989 Other specified soft tissue disorders: Secondary | ICD-10-CM

## 2020-03-07 ENCOUNTER — Other Ambulatory Visit: Payer: Self-pay | Admitting: Surgery

## 2020-03-09 ENCOUNTER — Other Ambulatory Visit: Payer: Federal, State, Local not specified - PPO

## 2020-03-22 ENCOUNTER — Ambulatory Visit
Admission: RE | Admit: 2020-03-22 | Discharge: 2020-03-22 | Disposition: A | Discharge status: Home / Self Care | Payer: Federal, State, Local not specified - PPO | Source: Ambulatory Visit | Attending: Surgery | Admitting: Surgery

## 2020-03-22 DIAGNOSIS — M7989 Other specified soft tissue disorders: Secondary | ICD-10-CM

## 2022-02-08 ENCOUNTER — Encounter: Payer: Federal, State, Local not specified - PPO | Admitting: Obstetrics and Gynecology

## 2022-03-05 ENCOUNTER — Telehealth: Payer: Self-pay

## 2022-03-05 NOTE — Telephone Encounter (Signed)
Former patient of Dr. Valeta Harms. States she had partial hysterectomy 2010 and would like to know which ovary was removed.  I did not see a surgery op note on file but I did see pathology report from 03/22/2008.  Left message for patient to call back.   FINAL DIAGNOSIS    MICROSCOPIC EXAMINATION AND DIAGNOSIS    UTERUS, LEFT FALLOPIAN TUBE AND OVARY:   - UTERINE CERVIX: NOT IDENTIFIED.   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   - BENIGN LEFT OVARY WITH STROMAL EDEMA AND BENIGN SEROUS   CYST/CYSTADENOMA AND   HEMORRHAGIC CORPUS LUTEUM.    gdt   Date Reported: 03/23/2008 Jaquelyn Bitter A. Rodney Cruise, MD

## 2022-03-06 NOTE — Telephone Encounter (Signed)
Spoke with patient and explained that pathology report indicates left tube and ovary removed. Patient informed.

## 2022-03-21 ENCOUNTER — Encounter: Payer: Federal, State, Local not specified - PPO | Admitting: Obstetrics and Gynecology

## 2023-07-22 ENCOUNTER — Emergency Department (HOSPITAL_COMMUNITY)

## 2023-07-22 ENCOUNTER — Other Ambulatory Visit: Payer: Self-pay

## 2023-07-22 ENCOUNTER — Encounter (HOSPITAL_COMMUNITY): Payer: Self-pay

## 2023-07-22 ENCOUNTER — Inpatient Hospital Stay (HOSPITAL_COMMUNITY)
Admission: EM | Admit: 2023-07-22 | Discharge: 2023-07-24 | DRG: 399 | Disposition: A | Discharge status: Home / Self Care | Attending: Surgery | Admitting: Surgery

## 2023-07-22 DIAGNOSIS — Z833 Family history of diabetes mellitus: Secondary | ICD-10-CM

## 2023-07-22 DIAGNOSIS — K3580 Unspecified acute appendicitis: Principal | ICD-10-CM | POA: Diagnosis present

## 2023-07-22 DIAGNOSIS — Z91048 Other nonmedicinal substance allergy status: Secondary | ICD-10-CM

## 2023-07-22 DIAGNOSIS — Z8249 Family history of ischemic heart disease and other diseases of the circulatory system: Secondary | ICD-10-CM | POA: Diagnosis not present

## 2023-07-22 DIAGNOSIS — Z79899 Other long term (current) drug therapy: Secondary | ICD-10-CM

## 2023-07-22 DIAGNOSIS — M8580 Other specified disorders of bone density and structure, unspecified site: Secondary | ICD-10-CM | POA: Diagnosis present

## 2023-07-22 LAB — CBC WITH DIFFERENTIAL/PLATELET
Abs Immature Granulocytes: 0.04 10*3/uL (ref 0.00–0.07)
Basophils Absolute: 0.1 10*3/uL (ref 0.0–0.1)
Basophils Relative: 1 %
Eosinophils Absolute: 0.1 10*3/uL (ref 0.0–0.5)
Eosinophils Relative: 1 %
HCT: 42.1 % (ref 36.0–46.0)
Hemoglobin: 14.1 g/dL (ref 12.0–15.0)
Immature Granulocytes: 0 %
Lymphocytes Relative: 12 %
Lymphs Abs: 1.6 10*3/uL (ref 0.7–4.0)
MCH: 31 pg (ref 26.0–34.0)
MCHC: 33.5 g/dL (ref 30.0–36.0)
MCV: 92.5 fL (ref 80.0–100.0)
Monocytes Absolute: 1.1 10*3/uL — ABNORMAL HIGH (ref 0.1–1.0)
Monocytes Relative: 9 %
Neutro Abs: 10.2 10*3/uL — ABNORMAL HIGH (ref 1.7–7.7)
Neutrophils Relative %: 77 %
Platelets: 256 10*3/uL (ref 150–400)
RBC: 4.55 MIL/uL (ref 3.87–5.11)
RDW: 12.6 % (ref 11.5–15.5)
WBC: 13.1 10*3/uL — ABNORMAL HIGH (ref 4.0–10.5)
nRBC: 0 % (ref 0.0–0.2)

## 2023-07-22 LAB — URINALYSIS, ROUTINE W REFLEX MICROSCOPIC
Bacteria, UA: NONE SEEN
Bilirubin Urine: NEGATIVE
Glucose, UA: NEGATIVE mg/dL
Ketones, ur: NEGATIVE mg/dL
Leukocytes,Ua: NEGATIVE
Nitrite: NEGATIVE
Protein, ur: NEGATIVE mg/dL
Specific Gravity, Urine: 1.004 — ABNORMAL LOW (ref 1.005–1.030)
pH: 6 (ref 5.0–8.0)

## 2023-07-22 LAB — COMPREHENSIVE METABOLIC PANEL WITH GFR
ALT: 21 U/L (ref 0–44)
AST: 22 U/L (ref 15–41)
Albumin: 4.4 g/dL (ref 3.5–5.0)
Alkaline Phosphatase: 101 U/L (ref 38–126)
Anion gap: 10 (ref 5–15)
BUN: 10 mg/dL (ref 8–23)
CO2: 24 mmol/L (ref 22–32)
Calcium: 9.5 mg/dL (ref 8.9–10.3)
Chloride: 102 mmol/L (ref 98–111)
Creatinine, Ser: 0.69 mg/dL (ref 0.44–1.00)
GFR, Estimated: >60 mL/min (ref >60)
Glucose, Bld: 108 mg/dL — ABNORMAL HIGH (ref 70–99)
Potassium: 3.4 mmol/L — ABNORMAL LOW (ref 3.5–5.1)
Sodium: 136 mmol/L (ref 135–145)
Total Bilirubin: 1.9 mg/dL — ABNORMAL HIGH (ref 0.0–1.2)
Total Protein: 7.5 g/dL (ref 6.5–8.1)

## 2023-07-22 LAB — LIPASE, BLOOD: Lipase: 33 U/L (ref 11–51)

## 2023-07-22 MED ORDER — HYDROMORPHONE HCL 1 MG/ML IJ SOLN: 1.0000 mg | INTRAMUSCULAR | Status: DC | PRN

## 2023-07-22 MED ORDER — DEXTROSE-SODIUM CHLORIDE 5-0.9 % IV SOLN: INTRAVENOUS | Status: DC

## 2023-07-22 MED ORDER — ONDANSETRON HCL 4 MG/2ML IJ SOLN: 4.0000 mg | Freq: Four times a day (QID) | INTRAMUSCULAR | Status: DC | PRN

## 2023-07-22 MED ORDER — PIPERACILLIN-TAZOBACTAM 3.375 G IVPB: 3.3750 g | Freq: Three times a day (TID) | INTRAVENOUS | Status: DC

## 2023-07-22 MED ORDER — ACETAMINOPHEN 325 MG PO TABS: 650.0000 mg | ORAL_TABLET | Freq: Four times a day (QID) | ORAL | Status: DC | PRN

## 2023-07-22 MED ORDER — ONDANSETRON 4 MG PO TBDP: 4.0000 mg | ORAL_TABLET | Freq: Four times a day (QID) | ORAL | Status: DC | PRN

## 2023-07-22 MED ORDER — SODIUM CHLORIDE 0.9 % IV BOLUS
1000.0000 mL | Freq: Once | INTRAVENOUS | Status: AC
  Administered 2023-07-22: 1000 mL via INTRAVENOUS

## 2023-07-22 MED ORDER — PIPERACILLIN-TAZOBACTAM 3.375 G IVPB
3.3750 g | Freq: Three times a day (TID) | INTRAVENOUS | Status: DC
  Administered 2023-07-23: 3.375 g via INTRAVENOUS
  Filled 2023-07-22: qty 50

## 2023-07-22 MED ORDER — MORPHINE SULFATE (PF) 4 MG/ML IV SOLN
4.0000 mg | Freq: Once | INTRAVENOUS | Status: DC
  Filled 2023-07-22: qty 1

## 2023-07-22 MED ORDER — ENOXAPARIN SODIUM 40 MG/0.4ML IJ SOSY
40.0000 mg | PREFILLED_SYRINGE | INTRAMUSCULAR | Status: DC
  Administered 2023-07-22: 40 mg via SUBCUTANEOUS
  Filled 2023-07-22: qty 0.4

## 2023-07-22 MED ORDER — ACETAMINOPHEN 650 MG RE SUPP: 650.0000 mg | Freq: Four times a day (QID) | RECTAL | Status: DC | PRN

## 2023-07-22 MED ORDER — METOPROLOL TARTRATE 5 MG/5ML IV SOLN: 5.0000 mg | Freq: Four times a day (QID) | INTRAVENOUS | Status: DC | PRN

## 2023-07-22 MED ORDER — ONDANSETRON HCL 4 MG/2ML IJ SOLN
4.0000 mg | Freq: Once | INTRAMUSCULAR | Status: AC
  Administered 2023-07-22: 4 mg via INTRAVENOUS
  Filled 2023-07-22: qty 2

## 2023-07-22 MED ORDER — IOHEXOL 300 MG/ML  SOLN
100.0000 mL | Freq: Once | INTRAMUSCULAR | Status: AC | PRN
  Administered 2023-07-22: 100 mL via INTRAVENOUS

## 2023-07-22 MED ORDER — PIPERACILLIN-TAZOBACTAM 3.375 G IVPB 30 MIN
3.3750 g | Freq: Once | INTRAVENOUS | Status: AC
  Administered 2023-07-22: 3.375 g via INTRAVENOUS
  Filled 2023-07-22: qty 50

## 2023-07-22 NOTE — H&P (Signed)
 Megan Lam is an 68 y.o. female.   Chief Complaint: Abdominal pain HPI: 68 year old female who around 2:00 this afternoon developed right lower quadrant abdominal pain which was severe.  No vomiting.  She had some periumbilical pain about a week ago that was quite mild but that went away.  No dysuria or polyuria.  CT scan showed acute appendicitis.  No signs of perforation or appendicolith.  She is comfortable currently.  Past Medical History:  Diagnosis Date   Allergy    Arthritis    Osteopenia 04/2015   T score -1.8 FRAX 14%/1.8%    Past Surgical History:  Procedure Laterality Date   ABDOMINAL HYSTERECTOMY      Family History  Problem Relation Age of Onset   Heart disease Father    Diabetes Father    Social History:  reports that she has never smoked. She has never used smokeless tobacco. She reports that she does not drink alcohol and does not use drugs.  Allergies:  Allergies  Allergen Reactions   Other Itching and Other (See Comments)    Environmental and seasonal allergies: Runny eyes and nose (also)    (Not in a hospital admission)   Results for orders placed or performed during the hospital encounter of 07/22/23 (from the past 48 hours)  Urinalysis, Routine w reflex microscopic -Urine, Clean Catch     Status: Abnormal   Collection Time: 07/22/23  5:15 PM  Result Value Ref Range   Color, Urine STRAW (A) YELLOW   APPearance CLEAR CLEAR   Specific Gravity, Urine 1.004 (L) 1.005 - 1.030   pH 6.0 5.0 - 8.0   Glucose, UA NEGATIVE NEGATIVE mg/dL   Hgb urine dipstick MODERATE (A) NEGATIVE   Bilirubin Urine NEGATIVE NEGATIVE   Ketones, ur NEGATIVE NEGATIVE mg/dL   Protein, ur NEGATIVE NEGATIVE mg/dL   Nitrite NEGATIVE NEGATIVE   Leukocytes,Ua NEGATIVE NEGATIVE   RBC / HPF 0-5 0 - 5 RBC/hpf   WBC, UA 0-5 0 - 5 WBC/hpf   Bacteria, UA NONE SEEN NONE SEEN   Squamous Epithelial / HPF 0-5 0 - 5 /HPF   Mucus PRESENT     Comment: Performed at [facility], [address] [place], [place] [address]  CBC with Differential     Status: Abnormal   Collection Time: 07/22/23  5:39 PM  Result Value Ref Range   WBC 13.1 (H) 4.0 - 10.5 K/uL   RBC 4.55 3.87 - 5.11 MIL/uL   Hemoglobin 14.1 12.0 - 15.0 g/dL   HCT 65.7 84.6 - 96.2 %   MCV 92.5 80.0 - 100.0 fL   MCH 31.0 26.0 - 34.0 pg   MCHC 33.5 30.0 - 36.0 g/dL   RDW 95.2 84.1 - 32.4 %   Platelets 256 150 - 400 K/uL   nRBC 0.0 0.0 - 0.2 %   Neutrophils Relative % 77 %   Neutro Abs 10.2 (H) 1.7 - 7.7 K/uL   Lymphocytes Relative 12 %   Lymphs Abs 1.6 0.7 - 4.0 K/uL   Monocytes Relative 9 %   Monocytes Absolute 1.1 (H) 0.1 - 1.0 K/uL   Eosinophils Relative 1 %   Eosinophils Absolute 0.1 0.0 - 0.5 K/uL   Basophils Relative 1 %   Basophils Absolute 0.1 0.0 - 0.1 K/uL   Immature Granulocytes 0 %   Abs Immature Granulocytes 0.04 0.00 - 0.07 K/uL    Comment: Performed at [facility], [address] [place] [address], [place], [place] [address]  Comprehensive metabolic panel     Status: Abnormal   Collection Time: 07/22/23  5:39 PM  Result Value Ref Range   Sodium 136 135 - 145 mmol/L   Potassium 3.4 (L) 3.5 - 5.1 mmol/L   Chloride 102 98 - 111 mmol/L   CO2 24 22 - 32 mmol/L   Glucose, Bld 108 (H) 70 - 99 mg/dL    Comment: Glucose reference range applies only to samples taken after fasting for at least 8 hours.   BUN 10 8 - 23 mg/dL   Creatinine, Ser 8.29 0.44 - 1.00 mg/dL   Calcium 9.5 8.9 - 56.2 mg/dL   Total Protein 7.5 6.5 - 8.1 g/dL   Albumin 4.4 3.5 - 5.0 g/dL   AST 22 15 - 41 U/L   ALT 21 0 - 44 U/L   Alkaline Phosphatase 101 38 - 126 U/L   Total Bilirubin 1.9 (H) 0.0 - 1.2 mg/dL   GFR, Estimated >13 >08 mL/min    Comment: (NOTE) Calculated using the CKD-EPI Creatinine Equation (2021)    Anion gap 10 5 - 15    Comment: Performed at [facility], [address], [place], [place] [address]  Lipase, blood     Status: None   Collection Time: 07/22/23   5:39 PM  Result Value Ref Range   Lipase 33 11 - 51 U/L    Comment: Performed at [facility], [address] [place], [place] [address]   CT ABDOMEN PELVIS W CONTRAST Result Date: 07/22/2023 CLINICAL DATA:  Right lower quadrant abdominal pain, tenderness to palpation EXAM: CT ABDOMEN AND PELVIS WITH CONTRAST TECHNIQUE: Multidetector CT imaging of the abdomen and pelvis was performed using the standard protocol following bolus administration of intravenous contrast. RADIATION DOSE REDUCTION: This exam was performed according to the departmental dose-optimization program which includes automated exposure control, adjustment of the mA and/or kV according to patient size and/or use of iterative reconstruction technique. CONTRAST:  OMNIPAQUE IOHEXOL 300 MG/ML  SOLN COMPARISON:  02/04/2013 FINDINGS: Lower chest: No acute pleural or parenchymal lung disease. Hepatobiliary: Multiple hepatic cysts. No acute liver abnormality. Gallbladder is moderately distended, with no evidence of cholelithiasis or cholecystitis. Pancreas: Unremarkable. No pancreatic ductal dilatation or surrounding inflammatory changes. Spleen: Normal in size without focal abnormality. Adrenals/Urinary Tract: 3 mm nonobstructing right renal calculus. No left-sided calculi. No obstructive uropathy within either kidney. The adrenals and bladder are unremarkable. Stomach/Bowel: No bowel obstruction or ileus. Abnormal dilated appendix measuring 1.5 cm in diameter, with mural thickening and mucosal hyperenhancement near the appendiceal orifice, consistent with acute appendicitis. There is periappendiceal fat stranding, with no evidence of perforation, fluid collection, or abscess. Vascular/Lymphatic: No significant vascular findings are present. No enlarged abdominal or pelvic lymph nodes. Reproductive: Status post hysterectomy. No adnexal masses. Other: Mesenteric edema right lower quadrant. No free fluid or free  intraperitoneal gas. No abdominal wall hernia. Musculoskeletal: No acute or destructive bony abnormalities. Reconstructed images demonstrate no additional findings. IMPRESSION: 1. Acute uncomplicated appendicitis. No perforation, fluid collection, or abscess. 2. Nonobstructing 3 mm right renal calculus. Electronically Signed   By: Bobbye Burrow M.D.   On: 07/22/2023 20:28    Review of Systems  Gastrointestinal:  Positive for abdominal pain, nausea and vomiting.  All other systems reviewed and are negative.   Blood pressure 125/65, pulse 91, temperature 99.3 F (37.4 C), temperature source Oral, resp. rate 18, height 5\' 4"  (1.626 m), weight 62 kg, SpO2 100%. Physical Exam HENT:  Head: Normocephalic.  Cardiovascular:     Rate and Rhythm: Normal rate.  Pulmonary:     Effort: Pulmonary effort is normal.  Abdominal:     General: Abdomen is flat.     Tenderness: There is abdominal tenderness in the right lower quadrant. There is guarding. There is no rebound.     Hernia: No hernia is present.  Skin:    General: Skin is warm.  Neurological:     Mental Status: She is alert.      Assessment/Plan Acute appendicitis without signs of perforation or abscess Reviewed the pathophysiology of appendicitis.   Discussed medical and surgical options for the treatment of appendicitis.  Pros and cons of each, recurrence rates, and complications of each reviewed.  Admit for IV antibiotics tonight and plan for surgery in a.m.  Questions were answered.    Rodrigo Clara, MD 07/22/2023, 9:11 PM  Moderate complexity

## 2023-07-22 NOTE — ED Triage Notes (Signed)
 Patient c/o abdominal pain x 1 day. Patient denies N/V/D. Patient denies fever.

## 2023-07-22 NOTE — ED Notes (Signed)
 Patient ambulated to the bathroom independently.

## 2023-07-22 NOTE — ED Notes (Signed)
 Surgical MD at bedside speaking with patient. Patient will have surgery in the morning. Patient can have clear liquids to drink. Patient is NPO after midnight.

## 2023-07-22 NOTE — ED Notes (Signed)
Consent signed electronically

## 2023-07-22 NOTE — ED Notes (Signed)
 Pt coming from doctors office to r/o appendicitis, presented with rt lower quad pain with specific pinpoint tenderness.

## 2023-07-22 NOTE — ED Provider Notes (Signed)
 [place] EMERGENCY DEPARTMENT AT [facility] Provider Note  CSN: [id number] Arrival date & time: 07/22/23 1709  Chief Complaint(s) Abdominal Pain  HPI Megan Lam is a 68 y.o. female with past medical history as below, significant for hysterectomy, arthritis, osteopenia who presents to the ED with complaint of abdominal pain x 24 hours  Patient reports onset of aching abdominal pain last night, initially around like is now has progressed to right lower quadrant.  Pain is sharp and stabbing.  Worsened with position changes or palpation.  No fevers or chills.  No nausea or vomiting.  No change in bowel or bladder function.  She has prior hysterectomy and partial oophorectomy  Past Medical History Past Medical History:  Diagnosis Date   Allergy    Arthritis    Osteopenia 04/2015   T score -1.8 FRAX 14%/1.8%   Patient Active Problem List   Diagnosis Date Noted   History of hysterectomy, supracervical 03/31/2014   Home Medication(s) Prior to Admission medications   Medication Sig Start Date End Date Taking? Authorizing Provider  calcium carbonate (OS-CAL - DOSED IN MG OF ELEMENTAL CALCIUM) 1250 (500 Ca) MG tablet Take 1 tablet by mouth daily.    [provider]  diclofenac  Sodium (VOLTAREN ) 1 % GEL Apply 2 g topically 4 (four) times daily. 04/30/19   Cirigliano, Kathrine Paris, DO  diphenhydrAMINE  (BENADRYL ) 25 MG tablet Take 2 tablets (50 mg total) by mouth every 4 (four) hours as needed for itching. 07/13/12   Palumbo, April, MD  ibuprofen  (ADVIL ) 600 MG tablet 1 tab BID with food x 7-10 days 04/30/19   Alben Hue, DO  Multiple Vitamin (MULTIVITAMIN WITH MINERALS) TABS Take 1 tablet by mouth daily.    [provider]  Omega-3 1000 MG CAPS Take 1 tablet by mouth daily.    [provider]  predniSONE  (DELTASONE ) 20 MG tablet Take 2 tablets (40 mg total) by mouth daily with breakfast. For the next four days Patient not taking: Reported on 04/11/2016  08/19/15   Dorenda Gandy, MD  traMADol  (ULTRAM ) 50 MG tablet Take 1 tablet (50 mg total) by mouth every 6 (six) hours as needed. Patient not taking: Reported on 04/30/2019 08/18/15   Davia Erps, MD  Vitamin D , Ergocalciferol , (DRISDOL ) 50000 units CAPS capsule Take 1 capsule (50,000 Units total) by mouth every 7 (seven) days. Take 1 every 7 days 05/03/15   Freddi Jaeger, NP  Zinc 100 MG TABS Take by mouth.    [provider]                                                                                                                                    Past Surgical History Past Surgical History:  Procedure Laterality Date   ABDOMINAL HYSTERECTOMY     Family History Family History  Problem Relation Age of Onset   Heart disease Father  Diabetes Father     Social History Social History   Tobacco Use   Smoking status: Never   Smokeless tobacco: Never  Substance Use Topics   Alcohol use: No    Alcohol/week: 0.0 standard drinks of alcohol   Drug use: No   Allergies Other  Review of Systems A thorough review of systems was obtained and all systems are negative except as noted in the HPI and PMH.   Physical Exam Vital Signs  I have reviewed the triage vital signs BP 125/65   Pulse 91   Temp 99.3 F (37.4 C) (Oral)   Resp 18   Ht 5\' 4"  (1.626 m)   Wt 62 kg   SpO2 100%   BMI 23.46 kg/m  Physical Exam Vitals and nursing note reviewed.  Constitutional:      General: She is not in acute distress.    Appearance: Normal appearance.  HENT:     Head: Normocephalic and atraumatic.     Right Ear: External ear normal.     Left Ear: External ear normal.     Nose: Nose normal.     Mouth/Throat:     Mouth: Mucous membranes are moist.  Eyes:     General: No scleral icterus.       Right eye: No discharge.        Left eye: No discharge.  Cardiovascular:     Rate and Rhythm: Normal rate and regular rhythm.     Pulses: Normal pulses.     Heart sounds: Normal  heart sounds.  Pulmonary:     Effort: Pulmonary effort is normal. No respiratory distress.     Breath sounds: Normal breath sounds. No stridor.  Abdominal:     General: Abdomen is flat. There is no distension.     Palpations: Abdomen is soft.     Tenderness: There is abdominal tenderness in the right lower quadrant. There is no guarding or rebound. Positive signs include Rovsing's sign and McBurney's sign.  Musculoskeletal:     Cervical back: No rigidity.     Right lower leg: No edema.     Left lower leg: No edema.  Skin:    General: Skin is warm and dry.     Capillary Refill: Capillary refill takes less than 2 seconds.  Neurological:     Mental Status: She is alert.  Psychiatric:        Mood and Affect: Mood normal.        Behavior: Behavior normal. Behavior is cooperative.     ED Results and Treatments Labs (all labs ordered are listed, but only abnormal results are displayed) Labs Reviewed  CBC WITH DIFFERENTIAL/PLATELET - Abnormal; Notable for the following components:      Result Value   WBC 13.1 (*)    Neutro Abs 10.2 (*)    Monocytes Absolute 1.1 (*)    All other components within normal limits  COMPREHENSIVE METABOLIC PANEL WITH GFR - Abnormal; Notable for the following components:   Potassium 3.4 (*)    Glucose, Bld 108 (*)    Total Bilirubin 1.9 (*)    All other components within normal limits  URINALYSIS, ROUTINE W REFLEX MICROSCOPIC - Abnormal; Notable for the following components:   Color, Urine STRAW (*)    Specific Gravity, Urine 1.004 (*)    Hgb urine dipstick MODERATE (*)    All other components within normal limits  LIPASE, BLOOD  Radiology CT ABDOMEN PELVIS W CONTRAST Result Date: 07/22/2023 CLINICAL DATA:  Right lower quadrant abdominal pain, tenderness to palpation EXAM: CT ABDOMEN AND PELVIS WITH CONTRAST TECHNIQUE: Multidetector  CT imaging of the abdomen and pelvis was performed using the standard protocol following bolus administration of intravenous contrast. RADIATION DOSE REDUCTION: This exam was performed according to the departmental dose-optimization program which includes automated exposure control, adjustment of the mA and/or kV according to patient size and/or use of iterative reconstruction technique. CONTRAST:  OMNIPAQUE IOHEXOL 300 MG/ML  SOLN COMPARISON:  02/04/2013 FINDINGS: Lower chest: No acute pleural or parenchymal lung disease. Hepatobiliary: Multiple hepatic cysts. No acute liver abnormality. Gallbladder is moderately distended, with no evidence of cholelithiasis or cholecystitis. Pancreas: Unremarkable. No pancreatic ductal dilatation or surrounding inflammatory changes. Spleen: Normal in size without focal abnormality. Adrenals/Urinary Tract: 3 mm nonobstructing right renal calculus. No left-sided calculi. No obstructive uropathy within either kidney. The adrenals and bladder are unremarkable. Stomach/Bowel: No bowel obstruction or ileus. Abnormal dilated appendix measuring 1.5 cm in diameter, with mural thickening and mucosal hyperenhancement near the appendiceal orifice, consistent with acute appendicitis. There is periappendiceal fat stranding, with no evidence of perforation, fluid collection, or abscess. Vascular/Lymphatic: No significant vascular findings are present. No enlarged abdominal or pelvic lymph nodes. Reproductive: Status post hysterectomy. No adnexal masses. Other: Mesenteric edema right lower quadrant. No free fluid or free intraperitoneal gas. No abdominal wall hernia. Musculoskeletal: No acute or destructive bony abnormalities. Reconstructed images demonstrate no additional findings. IMPRESSION: 1. Acute uncomplicated appendicitis. No perforation, fluid collection, or abscess. 2. Nonobstructing 3 mm right renal calculus. Electronically Signed   By: Bobbye Burrow M.D.   On: 07/22/2023 20:28     Pertinent labs & imaging results that were available during my care of the patient were reviewed by me and considered in my medical decision making (see MDM for details).  Medications Ordered in ED Medications  morphine (PF) 4 MG/ML injection 4 mg (0 mg Intravenous Hold 07/22/23 1820)  piperacillin-tazobactam (ZOSYN) IVPB 3.375 g (3.375 g Intravenous New Bag/Given 07/22/23 2049)  ondansetron (ZOFRAN) injection 4 mg (4 mg Intravenous Given 07/22/23 1819)  sodium chloride 0.9 % bolus 1,000 mL (0 mLs Intravenous Stopped 07/22/23 2042)  iohexol (OMNIPAQUE) 300 MG/ML solution 100 mL (100 mLs Intravenous Contrast Given 07/22/23 1950)                                                                                                                                     Procedures Procedures  (including critical care time)  Medical Decision Making / ED Course    Medical Decision Making:    Megan Lam is a 68 y.o. female with past medical history as below, significant for hysterectomy, arthritis, osteopenia who presents to the ED with complaint of abdominal pain x 24 hours. The complaint involves an extensive differential diagnosis and also carries with it a high risk of complications and  morbidity.  Serious etiology was considered. Ddx includes but is not limited to: Differential diagnosis includes but is not exclusive to ovarian cyst, ovarian torsion, acute appendicitis, urinary tract infection, endometriosis, bowel obstruction, hernia, colitis, renal colic, gastroenteritis, volvulus etc.   Complete initial physical exam performed, notably the patient was in no distress, non toxic.    Reviewed and confirmed nursing documentation for past medical history, family history, social history.  Vital signs reviewed.     Brief summary:  68 yo female w/ hx as above here with abd pain Rlq ttp, not peritoneal  Clinical Course as of 07/22/23 2053  Mon Jul 22, 2023  1816 Last po around 10am today [SG]   2034 CT w/ acute appendicitis w/o perforation.  She has low grade temp, leukocytosis; abd not peritoneal Will start zosyn, consult gen surg [SG]  2049 Spoke w/ Dr Cornett who accepts pt as primary [SG]    Clinical Course User Index [SG] Teddi Favors, DO    D/w pt at bedside regarding findings and plan, she is agreeable for admission             Additional history obtained: -Additional history obtained from na -External records from outside source obtained and reviewed including: Chart review including previous notes, labs, imaging, consultation notes including  Home medications Primary care documentation    Lab Tests: -I ordered, reviewed, and interpreted labs.   The pertinent results include:   Labs Reviewed  CBC WITH DIFFERENTIAL/PLATELET - Abnormal; Notable for the following components:      Result Value   WBC 13.1 (*)    Neutro Abs 10.2 (*)    Monocytes Absolute 1.1 (*)    All other components within normal limits  COMPREHENSIVE METABOLIC PANEL WITH GFR - Abnormal; Notable for the following components:   Potassium 3.4 (*)    Glucose, Bld 108 (*)    Total Bilirubin 1.9 (*)    All other components within normal limits  URINALYSIS, ROUTINE W REFLEX MICROSCOPIC - Abnormal; Notable for the following components:   Color, Urine STRAW (*)    Specific Gravity, Urine 1.004 (*)    Hgb urine dipstick MODERATE (*)    All other components within normal limits  LIPASE, BLOOD    Notable for wbc mild +  EKG   EKG Interpretation Date/Time:    Ventricular Rate:    PR Interval:    QRS Duration:    QT Interval:    QTC Calculation:   R Axis:      Text Interpretation:           Imaging Studies ordered: I ordered imaging studies including ctap I independently visualized the following imaging with scope of interpretation limited to determining acute life threatening conditions related to emergency care; findings noted above I agree with the radiologist  interpretation If any imaging was obtained with contrast I closely monitored patient for any possible adverse reaction a/w contrast administration in the emergency department   Medicines ordered and prescription drug management: Meds ordered this encounter  Medications   morphine (PF) 4 MG/ML injection 4 mg   ondansetron (ZOFRAN) injection 4 mg   sodium chloride 0.9 % bolus 1,000 mL   iohexol (OMNIPAQUE) 300 MG/ML solution 100 mL   piperacillin-tazobactam (ZOSYN) IVPB 3.375 g    Antibiotic Indication::   Intra-abdominal Infection    -I have reviewed the patients home medicines and have made adjustments as needed   Consultations Obtained: I requested consultation with the gen surg,  and discussed lab and imaging findings as well as pertinent plan - they recommend: admit   Cardiac Monitoring: Continuous pulse oximetry interpreted by myself, 100% on ra.    Social Determinants of Health:  Diagnosis or treatment significantly limited by social determinants of health: na   Reevaluation: After the interventions noted above, I reevaluated the patient and found that they have improved  Co morbidities that complicate the patient evaluation  Past Medical History:  Diagnosis Date   Allergy    Arthritis    Osteopenia 04/2015   T score -1.8 FRAX 14%/1.8%      Dispostion: Disposition decision including need for hospitalization was considered, and patient admitted to the hospital.    Final Clinical Impression(s) / ED Diagnoses Final diagnoses:  Acute appendicitis, unspecified acute appendicitis type        Teddi Favors, DO 07/22/23 2053

## 2023-07-23 ENCOUNTER — Inpatient Hospital Stay (HOSPITAL_COMMUNITY): Admitting: Anesthesiology

## 2023-07-23 ENCOUNTER — Encounter (HOSPITAL_COMMUNITY): Payer: Self-pay

## 2023-07-23 ENCOUNTER — Encounter (HOSPITAL_COMMUNITY): Admission: EM | Disposition: A | Discharge status: Home / Self Care | Payer: Self-pay | Source: Home / Self Care

## 2023-07-23 ENCOUNTER — Other Ambulatory Visit: Payer: Self-pay

## 2023-07-23 DIAGNOSIS — K3580 Unspecified acute appendicitis: Secondary | ICD-10-CM | POA: Diagnosis not present

## 2023-07-23 HISTORY — PX: LAPAROSCOPIC APPENDECTOMY: SHX408

## 2023-07-23 LAB — SURGICAL PCR SCREEN
MRSA, PCR: NEGATIVE
Staphylococcus aureus: NEGATIVE

## 2023-07-23 LAB — HIV ANTIBODY (ROUTINE TESTING W REFLEX): HIV Screen 4th Generation wRfx: NONREACTIVE

## 2023-07-23 SURGERY — APPENDECTOMY, LAPAROSCOPIC
Anesthesia: General

## 2023-07-23 MED ORDER — ACETAMINOPHEN 650 MG RE SUPP: 650.0000 mg | Freq: Four times a day (QID) | RECTAL | Status: DC | PRN

## 2023-07-23 MED ORDER — LACTATED RINGERS IV SOLN: INTRAVENOUS | Status: DC | PRN

## 2023-07-23 MED ORDER — TRAMADOL HCL 50 MG PO TABS
50.0000 mg | ORAL_TABLET | Freq: Four times a day (QID) | ORAL | Status: DC | PRN
  Administered 2023-07-23 – 2023-07-24 (×3): 50 mg via ORAL
  Filled 2023-07-23 (×3): qty 1

## 2023-07-23 MED ORDER — 0.9 % SODIUM CHLORIDE (POUR BTL) OPTIME
TOPICAL | Status: DC | PRN
  Administered 2023-07-23: 1000 mL

## 2023-07-23 MED ORDER — DEXMEDETOMIDINE HCL IN NACL 80 MCG/20ML IV SOLN
INTRAVENOUS | Status: AC
  Filled 2023-07-23: qty 20

## 2023-07-23 MED ORDER — PHENYLEPHRINE 80 MCG/ML (10ML) SYRINGE FOR IV PUSH (FOR BLOOD PRESSURE SUPPORT)
PREFILLED_SYRINGE | INTRAVENOUS | Status: AC
  Filled 2023-07-23: qty 10

## 2023-07-23 MED ORDER — LACTATED RINGERS IR SOLN
Status: DC | PRN
Start: 2023-07-23 — End: 2023-07-23
  Administered 2023-07-23: 1000 mL

## 2023-07-23 MED ORDER — FENTANYL CITRATE (PF) 250 MCG/5ML IJ SOLN
INTRAMUSCULAR | Status: AC
  Filled 2023-07-23: qty 5

## 2023-07-23 MED ORDER — ONDANSETRON HCL 4 MG/2ML IJ SOLN: 4.0000 mg | Freq: Four times a day (QID) | INTRAMUSCULAR | Status: DC | PRN

## 2023-07-23 MED ORDER — FENTANYL CITRATE (PF) 100 MCG/2ML IJ SOLN
INTRAMUSCULAR | Status: DC | PRN
  Administered 2023-07-23: 50 ug via INTRAVENOUS
  Administered 2023-07-23: 25 ug via INTRAVENOUS
  Administered 2023-07-23: 50 ug via INTRAVENOUS

## 2023-07-23 MED ORDER — DEXAMETHASONE SODIUM PHOSPHATE 10 MG/ML IJ SOLN
INTRAMUSCULAR | Status: DC | PRN
  Administered 2023-07-23: 4 mg via INTRAVENOUS

## 2023-07-23 MED ORDER — BUPIVACAINE-EPINEPHRINE (PF) 0.25% -1:200000 IJ SOLN
INTRAMUSCULAR | Status: AC
  Filled 2023-07-23: qty 30

## 2023-07-23 MED ORDER — ONDANSETRON 4 MG PO TBDP: 4.0000 mg | ORAL_TABLET | Freq: Four times a day (QID) | ORAL | Status: DC | PRN

## 2023-07-23 MED ORDER — FENTANYL CITRATE PF 50 MCG/ML IJ SOSY: 25.0000 ug | PREFILLED_SYRINGE | INTRAMUSCULAR | Status: DC | PRN

## 2023-07-23 MED ORDER — ACETAMINOPHEN 325 MG PO TABS
650.0000 mg | ORAL_TABLET | Freq: Four times a day (QID) | ORAL | Status: DC | PRN
  Administered 2023-07-24: 325 mg via ORAL
  Filled 2023-07-23 (×2): qty 2

## 2023-07-23 MED ORDER — MIDAZOLAM HCL 2 MG/2ML IJ SOLN
INTRAMUSCULAR | Status: AC
  Filled 2023-07-23: qty 2

## 2023-07-23 MED ORDER — ONDANSETRON HCL 4 MG/2ML IJ SOLN
INTRAMUSCULAR | Status: DC | PRN
  Administered 2023-07-23: 4 mg via INTRAVENOUS

## 2023-07-23 MED ORDER — HYDROMORPHONE HCL 1 MG/ML IJ SOLN: 1.0000 mg | INTRAMUSCULAR | Status: DC | PRN

## 2023-07-23 MED ORDER — ROCURONIUM BROMIDE 100 MG/10ML IV SOLN
INTRAVENOUS | Status: DC | PRN
  Administered 2023-07-23: 50 mg via INTRAVENOUS

## 2023-07-23 MED ORDER — SODIUM CHLORIDE 0.45 % IV SOLN
INTRAVENOUS | Status: AC
Start: 2023-07-23 — End: 2023-07-24

## 2023-07-23 MED ORDER — MIDAZOLAM HCL 5 MG/5ML IJ SOLN
INTRAMUSCULAR | Status: DC | PRN
  Administered 2023-07-23 (×2): 1 mg via INTRAVENOUS

## 2023-07-23 MED ORDER — LIDOCAINE HCL (CARDIAC) PF 100 MG/5ML IV SOSY
PREFILLED_SYRINGE | INTRAVENOUS | Status: DC | PRN
  Administered 2023-07-23: 60 mg via INTRAVENOUS

## 2023-07-23 MED ORDER — OXYCODONE HCL 5 MG PO TABS: 5.0000 mg | ORAL_TABLET | ORAL | Status: DC | PRN

## 2023-07-23 MED ORDER — PROPOFOL 10 MG/ML IV BOLUS
INTRAVENOUS | Status: AC
  Filled 2023-07-23: qty 20

## 2023-07-23 MED ORDER — BUPIVACAINE-EPINEPHRINE 0.25% -1:200000 IJ SOLN
INTRAMUSCULAR | Status: DC | PRN
  Administered 2023-07-23: 30 mL

## 2023-07-23 MED ORDER — PROPOFOL 10 MG/ML IV BOLUS
INTRAVENOUS | Status: DC | PRN
  Administered 2023-07-23: 10 mg via INTRAVENOUS
  Administered 2023-07-23: 80 mg via INTRAVENOUS
  Administered 2023-07-23: 20 mg via INTRAVENOUS

## 2023-07-23 MED ORDER — DIPHENHYDRAMINE HCL 25 MG PO CAPS: 25.0000 mg | ORAL_CAPSULE | Freq: Four times a day (QID) | ORAL | Status: DC | PRN

## 2023-07-23 MED ORDER — PIPERACILLIN-TAZOBACTAM 3.375 G IVPB
3.3750 g | Freq: Three times a day (TID) | INTRAVENOUS | Status: DC
  Administered 2023-07-23 – 2023-07-24 (×3): 3.375 g via INTRAVENOUS
  Filled 2023-07-23 (×3): qty 50

## 2023-07-23 MED ORDER — PHENYLEPHRINE HCL (PRESSORS) 10 MG/ML IV SOLN
INTRAVENOUS | Status: DC | PRN
  Administered 2023-07-23: 80 ug via INTRAVENOUS
  Administered 2023-07-23: 100 ug via INTRAVENOUS
  Administered 2023-07-23: 80 ug via INTRAVENOUS

## 2023-07-23 MED ORDER — DEXMEDETOMIDINE HCL IN NACL 80 MCG/20ML IV SOLN
INTRAVENOUS | Status: DC | PRN
  Administered 2023-07-23: 8 ug via INTRAVENOUS

## 2023-07-23 MED ORDER — ONDANSETRON HCL 4 MG/2ML IJ SOLN: 4.0000 mg | Freq: Once | INTRAMUSCULAR | Status: DC | PRN

## 2023-07-23 MED ORDER — ONDANSETRON HCL 4 MG/2ML IJ SOLN
INTRAMUSCULAR | Status: AC
  Filled 2023-07-23: qty 2

## 2023-07-23 MED ORDER — SUGAMMADEX SODIUM 200 MG/2ML IV SOLN
INTRAVENOUS | Status: DC | PRN
  Administered 2023-07-23: 75 mg via INTRAVENOUS
  Administered 2023-07-23: 125 mg via INTRAVENOUS

## 2023-07-23 MED ORDER — DIPHENHYDRAMINE HCL 25 MG PO CAPS
25.0000 mg | ORAL_CAPSULE | Freq: Four times a day (QID) | ORAL | Status: DC | PRN
  Administered 2023-07-23: 25 mg via ORAL
  Filled 2023-07-23: qty 1

## 2023-07-23 SURGICAL SUPPLY — 27 items
BAG COUNTER SPONGE SURGICOUNT (BAG) IMPLANT
CHLORAPREP W/TINT 26 (MISCELLANEOUS) ×1 IMPLANT
COVER SURGICAL LIGHT HANDLE (MISCELLANEOUS) ×1 IMPLANT
CUTTER FLEX LINEAR 45M (STAPLE) IMPLANT
DERMABOND ADVANCED .7 DNX12 (GAUZE/BANDAGES/DRESSINGS) ×1 IMPLANT
ELECT PENCIL ROCKER SW 15FT (MISCELLANEOUS) IMPLANT
ELECT REM PT RETURN 15FT ADLT (MISCELLANEOUS) ×1 IMPLANT
GLOVE SURG ORTHO 8.0 STRL STRW (GLOVE) ×1 IMPLANT
GOWN STRL REUS W/ TWL XL LVL3 (GOWN DISPOSABLE) ×2 IMPLANT
IRRIGATION SUCT STRKRFLW 2 WTP (MISCELLANEOUS) ×1 IMPLANT
KIT BASIN OR (CUSTOM PROCEDURE TRAY) ×1 IMPLANT
KIT TURNOVER KIT A (KITS) IMPLANT
RELOAD 45 VASCULAR/THIN (ENDOMECHANICALS) IMPLANT
RELOAD STAPLE 45 2.5 WHT GRN (ENDOMECHANICALS) IMPLANT
RELOAD STAPLE 45 3.5 BLU ETS (ENDOMECHANICALS) IMPLANT
RELOAD STAPLE TA45 3.5 REG BLU (ENDOMECHANICALS) ×1 IMPLANT
SET TUBE SMOKE EVAC HIGH FLOW (TUBING) ×1 IMPLANT
SHEARS HARMONIC 36 ACE (MISCELLANEOUS) ×1 IMPLANT
SPIKE FLUID TRANSFER (MISCELLANEOUS) ×1 IMPLANT
SUT MNCRL AB 4-0 PS2 18 (SUTURE) ×1 IMPLANT
SUT VICRYL 0 UR6 27IN ABS (SUTURE) IMPLANT
SYSTEM BAG RETRIEVAL 10MM (BASKET) ×1 IMPLANT
TOWEL OR 17X26 10 PK STRL BLUE (TOWEL DISPOSABLE) ×1 IMPLANT
TRAY LAPAROSCOPIC (CUSTOM PROCEDURE TRAY) ×1 IMPLANT
TROCAR 11X100 Z THREAD (TROCAR) ×1 IMPLANT
TROCAR BALLN 12MMX100 BLUNT (TROCAR) ×1 IMPLANT
TROCAR Z-THREAD OPTICAL 5X100M (TROCAR) ×1 IMPLANT

## 2023-07-23 NOTE — Plan of Care (Signed)
  Problem: Clinical Measurements: Goal: Diagnostic test results will improve Outcome: Progressing   Problem: Activity: Goal: Risk for activity intolerance will decrease Outcome: Progressing   Problem: Coping: Goal: Level of anxiety will decrease Outcome: Progressing   

## 2023-07-23 NOTE — Plan of Care (Signed)

## 2023-07-23 NOTE — Transfer of Care (Signed)
 Immediate Anesthesia Transfer of Care Note  Patient: Megan Lam  Procedure(s) Performed: APPENDECTOMY, LAPAROSCOPIC  Patient Location: PACU  Anesthesia Type:General  Level of Consciousness: drowsy and patient cooperative  Airway & Oxygen Therapy: Patient Spontanous Breathing and Patient connected to face mask oxygen  Post-op Assessment: Report given to RN and Post -op Vital signs reviewed and stable  Post vital signs: Reviewed and stable  Last Vitals:  Vitals Value Taken Time  BP 133/72 07/23/23 1054  Temp    Pulse 81 07/23/23 1056  Resp 19 07/23/23 1056  SpO2 100 % 07/23/23 1056  Vitals shown include unfiled device data.  Last Pain:  Vitals:   07/23/23 0855  TempSrc: Oral  PainSc:          Complications: No notable events documented.

## 2023-07-23 NOTE — Discharge Instructions (Addendum)

## 2023-07-23 NOTE — Anesthesia Preprocedure Evaluation (Addendum)
 Anesthesia Evaluation  Patient identified by MRN, date of birth, ID band Patient awake    Reviewed: Allergy & Precautions, NPO status , Patient's Chart, lab work & pertinent test results  Airway Mallampati: II  TM Distance: >3 FB Neck ROM: Full    Dental  (+) Teeth Intact, Dental Advisory Given   Pulmonary neg pulmonary ROS   Pulmonary exam normal breath sounds clear to auscultation       Cardiovascular negative cardio ROS Normal cardiovascular exam Rhythm:Regular Rate:Normal     Neuro/Psych negative neurological ROS  negative psych ROS   GI/Hepatic Neg liver ROS,,,Acute appendicitis   Endo/Other  negative endocrine ROS    Renal/GU negative Renal ROS     Musculoskeletal  (+) Arthritis ,    Abdominal   Peds  Hematology negative hematology ROS (+)   Anesthesia Other Findings   Reproductive/Obstetrics                             Anesthesia Physical Anesthesia Plan  ASA: 2  Anesthesia Plan: General   Post-op Pain Management: Tylenol PO (pre-op)* and Toradol IV (intra-op)*   Induction: Intravenous  PONV Risk Score and Plan: 4 or greater and Midazolam, Dexamethasone and Ondansetron  Airway Management Planned: Oral ETT  Additional Equipment:   Intra-op Plan:   Post-operative Plan: Extubation in OR  Informed Consent: I have reviewed the patients History and Physical, chart, labs and discussed the procedure including the risks, benefits and alternatives for the proposed anesthesia with the patient or authorized representative who has indicated his/her understanding and acceptance.     Dental advisory given  Plan Discussed with: CRNA  Anesthesia Plan Comments:        Anesthesia Quick Evaluation

## 2023-07-23 NOTE — Anesthesia Procedure Notes (Signed)
 Procedure Name: Intubation Date/Time: 07/23/2023 9:48 AM  Performed by: Patt Boozer, CRNAPre-anesthesia Checklist: Patient identified, Emergency Drugs available, Suction available and Patient being monitored Patient Re-evaluated:Patient Re-evaluated prior to induction Oxygen Delivery Method: Circle system utilized Preoxygenation: Pre-oxygenation with 100% oxygen Induction Type: IV induction Ventilation: Mask ventilation without difficulty Laryngoscope Size: Mac and 3 Grade View: Grade II Tube type: Oral Tube size: 7.0 mm Number of attempts: 1 Airway Equipment and Method: Stylet Placement Confirmation: ETT inserted through vocal cords under direct vision, positive ETCO2 and breath sounds checked- equal and bilateral Secured at: 22 cm Tube secured with: Tape Dental Injury: Teeth and Oropharynx as per pre-operative assessment

## 2023-07-23 NOTE — Op Note (Signed)
 OPERATIVE REPORT - LAPAROSCOPIC APPENDECTOMY  Preop diagnosis:  Acute appendicitis  Postop diagnosis:  same  Procedure:  Laparoscopic appendectomy  Surgeon:  Oralee Billow, MD  Anesthesia:  general endotracheal  Estimated blood loss:  minimal  Preparation:  Chlora-prep  Complications:  none  Indications: Patient is a 68 year old female who presented to the emergency department with abdominal pain and signs and symptoms of acute appendicitis.  This was confirmed on CT scan.  Patient was admitted and started on intravenous antibiotics.  She now comes to the operating room for appendectomy.  Procedure:  Patient was brought to the operating room and placed in a supine position on the operating room table. Following administration of general anesthesia, a time out was held and the patient's name and procedure was confirmed. Patient was then prepped and draped in the usual strict aseptic fashion.  After ascertaining that an adequate level of anesthesia had been achieved, a peri-umbilical incision was made with a #15 blade. Dissection was carried down to the fascia. Fascia was incised in the midline and the peritoneal cavity was entered cautiously. A #0-vicryl pursestring suture was placed in the fascia. An Hassan cannula was introduced under direct vision and secured with the pursestring suture. The abdomen was insufflated with carbon dioxide. The laparoscope was introduced and the abdomen was explored. Operative ports were placed in the right upper quadrant and left lower quadrant.  The appendix was markedly dilated and adherent to the inferior lateral portion of the cecum.  It was gently mobilized.  Mesoappendix was taken down with the harmonic scalpel.  Dissection was carried down to the base of the appendix which was cleared circumferentially.  Base of the appendix was transected with an Endo GIA stapler.  Good hemostasis was noted along the staple line.  The appendix was placed into an  endo-catch bag and withdrawn through the umbilical port. The #0-vicryl pursestring suture was tied securely.  Right lower quadrant was irrigated with warm saline which was evacuated. Good hemostasis was noted. Ports were removed under direct vision. Good hemostasis was noted at the port sites. Pneumoperitoneum was released.  Skin incisions were anesthetized with local anesthetic. Wounds were closed with interrupted 4-0 Monocryl subcuticular sutures. Wounds were washed and dried and Dermabond was applied. The patient was awakened from anesthesia and brought to the recovery room. The patient tolerated the procedure well.  Oralee Billow, MD [facility] Office: [phone]

## 2023-07-23 NOTE — Interval H&P Note (Signed)
 History and Physical Interval Note:  07/23/2023 8:51 AM  Megan Lam  has presented today for surgery, with the diagnosis of Acute appendicitis.  The various methods of treatment have been discussed with the patient and family. After consideration of risks, benefits and other options for treatment, the patient has consented to    Procedure(s): APPENDECTOMY, LAPAROSCOPIC (N/A) as a surgical intervention.    The patient's history has been reviewed, patient examined, no change in status, stable for surgery.  I have reviewed the patient's chart and labs.  Questions were answered to the patient's satisfaction.    Oralee Billow, MD [facility] practice Office: [phone]   Oralee Billow

## 2023-07-23 NOTE — Anesthesia Postprocedure Evaluation (Signed)
 Anesthesia Post Note  Patient: Megan Lam  Procedure(s) Performed: APPENDECTOMY, LAPAROSCOPIC     Patient location during evaluation: PACU Anesthesia Type: General Level of consciousness: awake and alert Pain management: pain level controlled Vital Signs Assessment: post-procedure vital signs reviewed and stable Respiratory status: spontaneous breathing, nonlabored ventilation and respiratory function stable Cardiovascular status: blood pressure returned to baseline and stable Postop Assessment: no apparent nausea or vomiting Anesthetic complications: no   No notable events documented.  Last Vitals:    Last Pain:                 Erin Havers

## 2023-07-24 ENCOUNTER — Other Ambulatory Visit (HOSPITAL_COMMUNITY): Payer: Self-pay

## 2023-07-24 ENCOUNTER — Encounter (HOSPITAL_COMMUNITY): Payer: Self-pay | Admitting: Surgery

## 2023-07-24 LAB — SURGICAL PATHOLOGY

## 2023-07-24 MED ORDER — TRAMADOL HCL 50 MG PO TABS
50.0000 mg | ORAL_TABLET | Freq: Four times a day (QID) | ORAL | 0 refills | Status: AC | PRN
  Filled 2023-07-24: qty 15, 4d supply, fill #0

## 2023-07-24 MED ORDER — TRAMADOL HCL 50 MG PO TABS: 50.0000 mg | ORAL_TABLET | Freq: Four times a day (QID) | ORAL | 0 refills | Status: DC | PRN

## 2023-07-24 MED ORDER — ACETAMINOPHEN 325 MG PO TABS: 1000.0000 mg | ORAL_TABLET | Freq: Four times a day (QID) | ORAL | Status: AC | PRN

## 2023-07-24 NOTE — Progress Notes (Signed)
 Patient was discharged to home, AVS reviewed and all questions answered. Prescription delivered to the bedside. Patient to schedule follow up appointment. IV removed. Staff assisted to exit, family provided transportation.

## 2023-07-24 NOTE — Progress Notes (Signed)
   07/24/23 1110  TOC Brief Assessment  Insurance and Status Reviewed  Patient has primary care physician Yes  Home environment has been reviewed home  Prior level of function: independent  Prior/Current Home Services No current home services  Social Drivers of Health Review SDOH reviewed no interventions necessary  Readmission risk has been reviewed Yes  Transition of care needs no transition of care needs at this time

## 2023-07-24 NOTE — Progress Notes (Signed)
 Mobility Specialist - Progress Note   07/24/23 0901  Mobility  Activity Ambulated independently in hallway  Level of Assistance Independent  Assistive Device None  Distance Ambulated (ft) 500 ft  Activity Response Tolerated well  Mobility Referral Yes  Mobility visit 1 Mobility  Mobility Specialist Start Time (ACUTE ONLY) 0848  Mobility Specialist Stop Time (ACUTE ONLY) 0901  Mobility Specialist Time Calculation (min) (ACUTE ONLY) 13 min   Pt received in bed and agreeable to mobility. No complaints during session. Pt to bed after session with all needs met.    [facility]

## 2023-07-24 NOTE — Discharge Summary (Signed)
 [facility] Discharge Summary   Patient ID: Megan Lam MRN: [id number] DOB/AGE: November 07, 1955 68 y.o.  Admit date: 07/22/2023 Discharge date: 07/24/2023  Admitting Diagnosis: Acute appendicitis without signs of perforation or abscess   Discharge Diagnosis Patient Active Problem List   Diagnosis Date Noted   Acute appendicitis 07/22/2023   History of hysterectomy, supracervical 03/31/2014    Consultants None Imaging: CT ABDOMEN PELVIS W CONTRAST Result Date: 07/22/2023 CLINICAL DATA:  Right lower quadrant abdominal pain, tenderness to palpation EXAM: CT ABDOMEN AND PELVIS WITH CONTRAST TECHNIQUE: Multidetector CT imaging of the abdomen and pelvis was performed using the standard protocol following bolus administration of intravenous contrast. RADIATION DOSE REDUCTION: This exam was performed according to the departmental dose-optimization program which includes automated exposure control, adjustment of the mA and/or kV according to patient size and/or use of iterative reconstruction technique. CONTRAST:  OMNIPAQUE IOHEXOL 300 MG/ML  SOLN COMPARISON:  02/04/2013 FINDINGS: Lower chest: No acute pleural or parenchymal lung disease. Hepatobiliary: Multiple hepatic cysts. No acute liver abnormality. Gallbladder is moderately distended, with no evidence of cholelithiasis or cholecystitis. Pancreas: Unremarkable. No pancreatic ductal dilatation or surrounding inflammatory changes. Spleen: Normal in size without focal abnormality. Adrenals/Urinary Tract: 3 mm nonobstructing right renal calculus. No left-sided calculi. No obstructive uropathy within either kidney. The adrenals and bladder are unremarkable. Stomach/Bowel: No bowel obstruction or ileus. Abnormal dilated appendix measuring 1.5 cm in diameter, with mural thickening and mucosal hyperenhancement near the appendiceal orifice, consistent with acute appendicitis. There is periappendiceal fat stranding, with no evidence of  perforation, fluid collection, or abscess. Vascular/Lymphatic: No significant vascular findings are present. No enlarged abdominal or pelvic lymph nodes. Reproductive: Status post hysterectomy. No adnexal masses. Other: Mesenteric edema right lower quadrant. No free fluid or free intraperitoneal gas. No abdominal wall hernia. Musculoskeletal: No acute or destructive bony abnormalities. Reconstructed images demonstrate no additional findings. IMPRESSION: 1. Acute uncomplicated appendicitis. No perforation, fluid collection, or abscess. 2. Nonobstructing 3 mm right renal calculus. Electronically Signed   By: Bobbye Burrow M.D.   On: 07/22/2023 20:28    Procedures Dr. Sofia Dunn (07/23/23) - Laparoscopic Appendectomy  Hospital Course:  Megan Lam is a 68 yo F who presented for surgery, with the diagnosis of Acute appendicitis. Workup with CT scan showed acute appendicitis. No signs of perforation or appendicolith. .  Patient was admitted and underwent procedure listed above. Tolerated procedure well and was transferred to the floor.  Diet was advanced as tolerated.  On POD 1, the patient was voiding well, tolerating diet, ambulating well, pain well controlled, vital signs stable, incisions c/d/i and felt stable for discharge home.  Patient will follow up in our office in 4 weeks and knows to call with questions or concerns. She will call to confirm appointment date/time.    I have personally reviewed the patients medication history on the [place] controlled substance database.   Physical Exam: General:  Alert, NAD, pleasant, comfortable Abd: Soft, ND, mild tenderness, incisions C/D/I   Allergies as of 07/24/2023       Reactions   Other Itching, Other (See Comments)   Environmental and seasonal allergies: Runny eyes and nose (also)        Medication List     TAKE these medications    acetaminophen 325 MG tablet Commonly known as: TYLENOL Take 3 tablets (975 mg total) by mouth every 6 (six) hours  as needed for mild pain (pain score 1-3) (or Fever >/= 101).   cholecalciferol 25 MCG (1000 UNIT)  tablet Commonly known as: VITAMIN D3 Take 1,000 Units by mouth daily.   diclofenac  Sodium 1 % Gel Commonly known as: Voltaren  Apply 2 g topically 4 (four) times daily.   diphenhydrAMINE  25 MG tablet Commonly known as: Benadryl  Take 2 tablets (50 mg total) by mouth every 4 (four) hours as needed for itching.   ibuprofen  600 MG tablet Commonly known as: ADVIL  1 tab BID with food x 7-10 days   multivitamin with minerals Tabs tablet Take 1 tablet by mouth once a week.   predniSONE  20 MG tablet Commonly known as: DELTASONE  Take 2 tablets (40 mg total) by mouth daily with breakfast. For the next four days   traMADol  50 MG tablet Commonly known as: ULTRAM  Take 1 tablet (50 mg total) by mouth every 6 (six) hours as needed for moderate pain (pain score 4-6) (mild pain). What changed: reasons to take this   Vitamin D  (Ergocalciferol ) 1.25 MG (50000 UNIT) Caps capsule Commonly known as: DRISDOL  Take 1 capsule (50,000 Units total) by mouth every 7 (seven) days. Take 1 every 7 days          Follow-up Information     Maczis, Puja Gosai, PA-C Follow up.   Specialty: General Surgery Contact information: [address] [place] [address] [place] [address] [phone]                 Signed: Mirta Ammon, [facility] Surgery 07/24/2023, 11:14 AM
